# Patient Record
Sex: Female | Born: 1985
Health system: Southern US, Community
[De-identification: ages and names within clinical notes are randomized; demographics above are authoritative.]

## PROBLEM LIST (undated history)

## (undated) DIAGNOSIS — J45909 Unspecified asthma, uncomplicated: Secondary | ICD-10-CM

## (undated) DIAGNOSIS — F419 Anxiety disorder, unspecified: Secondary | ICD-10-CM

## (undated) DIAGNOSIS — D219 Benign neoplasm of connective and other soft tissue, unspecified: Secondary | ICD-10-CM

## (undated) DIAGNOSIS — G43909 Migraine, unspecified, not intractable, without status migrainosus: Secondary | ICD-10-CM

## (undated) HISTORY — PX: NO PAST SURGERIES: SHX2092

---
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
## 2005-04-22 ENCOUNTER — Emergency Department (HOSPITAL_COMMUNITY): Admission: EM | Admit: 2005-04-22 | Discharge: 2005-04-22 | Payer: Self-pay | Admitting: Family Medicine

## 2005-10-01 ENCOUNTER — Emergency Department (HOSPITAL_COMMUNITY): Admission: EM | Admit: 2005-10-01 | Discharge: 2005-10-01 | Payer: Self-pay | Admitting: Emergency Medicine

## 2006-06-26 ENCOUNTER — Emergency Department (HOSPITAL_COMMUNITY): Admission: EM | Admit: 2006-06-26 | Discharge: 2006-06-26 | Payer: Self-pay | Admitting: Family Medicine

## 2007-01-30 ENCOUNTER — Inpatient Hospital Stay (HOSPITAL_COMMUNITY): Admission: AD | Admit: 2007-01-30 | Discharge: 2007-01-30 | Payer: Self-pay | Admitting: Obstetrics and Gynecology

## 2007-02-01 ENCOUNTER — Emergency Department (HOSPITAL_COMMUNITY): Admission: EM | Admit: 2007-02-01 | Discharge: 2007-02-01 | Payer: Self-pay | Admitting: Emergency Medicine

## 2007-04-13 ENCOUNTER — Emergency Department (HOSPITAL_COMMUNITY): Admission: EM | Admit: 2007-04-13 | Discharge: 2007-04-13 | Payer: Self-pay | Admitting: Emergency Medicine

## 2007-06-02 ENCOUNTER — Emergency Department (HOSPITAL_COMMUNITY): Admission: EM | Admit: 2007-06-02 | Discharge: 2007-06-02 | Payer: Self-pay | Admitting: Emergency Medicine

## 2008-11-06 ENCOUNTER — Emergency Department (HOSPITAL_COMMUNITY): Admission: EM | Admit: 2008-11-06 | Discharge: 2008-11-06 | Payer: Self-pay | Admitting: Emergency Medicine

## 2009-03-20 ENCOUNTER — Emergency Department (HOSPITAL_COMMUNITY): Admission: EM | Admit: 2009-03-20 | Discharge: 2009-03-20 | Payer: Self-pay | Admitting: Emergency Medicine

## 2010-02-04 ENCOUNTER — Encounter: Payer: Self-pay | Admitting: Internal Medicine

## 2010-03-29 ENCOUNTER — Telehealth: Payer: Self-pay | Admitting: Internal Medicine

## 2010-03-30 ENCOUNTER — Ambulatory Visit: Payer: Self-pay | Admitting: Internal Medicine

## 2010-03-30 DIAGNOSIS — J45909 Unspecified asthma, uncomplicated: Secondary | ICD-10-CM | POA: Insufficient documentation

## 2010-04-01 ENCOUNTER — Ambulatory Visit: Payer: Self-pay | Admitting: Internal Medicine

## 2010-04-13 ENCOUNTER — Ambulatory Visit
Admission: RE | Admit: 2010-04-13 | Discharge: 2010-04-13 | Payer: Self-pay | Source: Home / Self Care | Attending: Internal Medicine | Admitting: Internal Medicine

## 2010-04-15 ENCOUNTER — Encounter: Payer: Self-pay | Admitting: Internal Medicine

## 2010-04-15 ENCOUNTER — Ambulatory Visit
Admission: RE | Admit: 2010-04-15 | Discharge: 2010-04-15 | Payer: Self-pay | Source: Home / Self Care | Attending: Internal Medicine | Admitting: Internal Medicine

## 2010-04-19 ENCOUNTER — Ambulatory Visit: Admit: 2010-04-19 | Payer: Self-pay | Admitting: Internal Medicine

## 2010-05-13 NOTE — Assessment & Plan Note (Signed)
Summary: tb reading//cm  Nurse Visit   Allergies: No Known Drug Allergies  PPD Results    Date of reading: 04/01/2010    Results: < 5mm    Interpretation: negative

## 2010-05-13 NOTE — Assessment & Plan Note (Signed)
Summary: TB READING/RCD  Nurse Visit   Vitals Entered By: Duard Brady LPN (April 15, 2010 2:14 PM)  Allergies: No Known Drug Allergies  PPD Results    Date of reading: 04/15/2010    Results: < 5mm    Interpretation: negative

## 2010-05-13 NOTE — Letter (Signed)
Summary: TB Skin Test  All     ,     Phone:   Fax:           TB Skin Test    Nikka HENDERSON    Date TB Test Placed:  ________________  L or R forearm  TB Test Placed by:  ___________________  Lot #:  __________________        Expiration Date: _____________  Date TB Test Read:  ____________________    Result ___________MM  TB Test Read by:  _______________

## 2010-05-13 NOTE — Miscellaneous (Signed)
Summary: TDAP Vaccine/[facility]  TDAP Vaccine/[facility]   Imported By: Maryln Gottron 04/21/2010 13:00:33  _____________________________________________________________________  External Attachment:    Type:   Image     Comment:   External Document

## 2010-05-13 NOTE — Letter (Signed)
Summary: PATIENT HX FORM  PATIENT HX FORM   Imported By: Georgian Co 04/01/2010 13:56:13  _____________________________________________________________________  External Attachment:    Type:   Image     Comment:   External Document

## 2010-05-13 NOTE — Assessment & Plan Note (Signed)
Summary: fup/tb test and varicella titer/ok per Dr K/cjr   Vital Signs:  Patient profile:   25 year old female Height:      66 inches Weight:      181 pounds BMI:     29.32 Temp:     98.4 degrees F oral BP sitting:   108 / 68  (left arm) Cuff size:   regular  Vitals Entered By: Duard Brady LPN (March 30, 2010 3:21 PM) CC: re-establish  Is Patient Diabetic? No   CC:  re-establish .  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  She is also seen to reestablish with our practice.  She has enjoyed excellent health with some mild exercise-induced asthma only.  She has had a Pap smear earlier  this year and does exercise regularly  Preventive Screening-Counseling & Management  Alcohol-Tobacco     Smoking Status: quit  Caffeine-Diet-Exercise     Does Patient Exercise: yes  Allergies: No Known Drug Allergies  Past History:  Past Medical History: Asthma-exercise-induced  Past Surgical History: unremarkable  Family History: Reviewed history and no changes required. Father age 87- good health, h/o alcohol Mother age 24- good health one sister- healthy  Social History: Reviewed history and no changes required. Single romote tobcco Regular exercise-yes Nursing studentSmoking Status:  quit Does Patient Exercise:  yes  Review of Systems  The patient denies anorexia, fever, weight loss, weight gain, vision loss, decreased hearing, hoarseness, chest pain, syncope, dyspnea on exertion, peripheral edema, prolonged cough, headaches, hemoptysis, abdominal pain, melena, hematochezia, severe indigestion/heartburn, hematuria, incontinence, genital sores, muscle weakness, suspicious skin lesions, transient blindness, difficulty walking, depression, unusual weight change, abnormal bleeding, enlarged lymph nodes, angioedema, and breast masses.    Physical Exam  General:  Well-developed,well-nourished,in no acute distress;  alert,appropriate and cooperative throughout examination Head:  Normocephalic and atraumatic without obvious abnormalities. No apparent alopecia or balding. Eyes:  No corneal or conjunctival inflammation noted. EOMI. Perrla. Funduscopic exam benign, without hemorrhages, exudates or papilledema. Vision grossly normal. Ears:  External ear exam shows no significant lesions or deformities.  Otoscopic examination reveals clear canals, tympanic membranes are intact bilaterally without bulging, retraction, inflammation or discharge. Hearing is grossly normal bilaterally. Mouth:  Oral mucosa and oropharynx without lesions or exudates.  Teeth in good repair. Neck:  No deformities, masses, or tenderness noted. Chest Wall:  No deformities, masses, or tenderness noted. Lungs:  Normal respiratory effort, chest expands symmetrically. Lungs are clear to auscultation, no crackles or wheezes. Heart:  Normal rate and regular rhythm. S1 and S2 normal without gallop, murmur, click, rub or other extra sounds. Abdomen:  Bowel sounds positive,abdomen soft and non-tender without masses, organomegaly or hernias noted. Msk:  No deformity or scoliosis noted of thoracic or lumbar spine.   Pulses:  R and L carotid,radial,femoral,dorsalis pedis and posterior tibial pulses are full and equal bilaterally Extremities:  No clubbing, cyanosis, edema, or deformity noted with normal full range of motion of all joints.   Neurologic:  alert & oriented X3, cranial nerves II-XII intact, gait normal, and DTRs symmetrical and normal.  alert & oriented X3, cranial nerves II-XII intact, gait normal, and DTRs symmetrical and normal.   Skin:  Intact without suspicious lesions or rashes Cervical Nodes:  No lymphadenopathy noted Axillary Nodes:  No palpable lymphadenopathy Inguinal Nodes:  No significant adenopathy Psych:  Cognition and judgment appear intact. Alert and cooperative with normal attention span and concentration. No apparent  delusions, illusions, hallucinations   Impression &  Recommendations:  Problem # 1:  Preventive Health Care (ICD-V70.0)  Complete Medication List: 1)  Proventil Hfa 108 (90 Base) Mcg/act Aers (Albuterol sulfate) .... Prn  Other Orders: TB Skin Test [phone]) Admin 1st Vaccine (60454) Varicella  [phone]) Admin of Any Addtl Vaccine (91478)  Patient Instructions: 1)  Limit your Sodium (Salt). 2)  It is important that you exercise regularly at least 20 minutes 5 times a week. If you develop chest pain, have severe difficulty breathing, or feel very tired , stop exercising immediately and seek medical attention.   Orders Added: 1)  Est. Patient 18-39 years [99395] 2)  TB Skin Test [86580] 3)  Admin 1st Vaccine [90471] 4)  Varicella  [90716] 5)  Admin of Any Addtl Vaccine [90472]   Immunization History:  Varicella Immunization History:    Varicella # 1:  Varicella (03/30/2010)  Tetanus/Td Immunization History:    Tetanus/Td:  Tdap (04/25/2009)  Influenza Immunization History:    Influenza:  Historical (02/09/2010)  Immunizations Administered:  PPD Skin Test:    Vaccine Type: PPD    Site: right forearm    Mfr: Sanofi Pasteur    Dose: 0.1 ml    Route: ID    Given by: Duard Brady LPN    Exp. Date: 02/11/2011    Lot #: [id number]    Physician counseled: yes  Varicella Vaccine # 1:    Vaccine Type: Varicella    Site: left deltoid    Mfr: Merck    Dose: 0.5 ml    Route: IM    Given by: Duard Brady LPN    Exp. Date: 06/25/2011    Lot #: [id number]    VIS given: 06/22/06 version given March 30, 2010.    Physician counseled: yes   Immunization History:  Tetanus/Td Immunization History:    Tetanus/Td:  Tdap (04/25/2009)  Influenza Immunization History:    Influenza:  Historical (02/09/2010)  Immunizations Administered:  PPD Skin Test:    Vaccine Type: PPD    Site: right forearm    Mfr: Sanofi Pasteur    Dose: 0.1 ml    Route: ID    Given by:  Duard Brady LPN    Exp. Date: 02/11/2011    Lot #: [id number]    Physician counseled: yes  Varicella Vaccine # 1:    Vaccine Type: Varicella    Site: left deltoid    Mfr: Merck    Dose: 0.5 ml    Route: IM    Given by: Duard Brady LPN    Exp. Date: 06/25/2011    Lot #: [id number]    VIS given: 06/22/06 version given March 30, 2010.    Physician counseled: yes

## 2010-05-13 NOTE — Progress Notes (Signed)
Summary: Pt req tb test and varicella titer asap and to re-est   Phone Note Call from Patient Call back at Home Phone [phone]   Caller: Patient Summary of Call: Pt called and said that she needs to get a 2 part tb test and varicella titer done before January. Pt is req to re-est with Dr. Amador Cunas. Pt last seen in 2006. Pls advise.  Initial call taken by: Lucy Antigua,  March 29, 2010 10:14 AM  Follow-up for Phone Call        ok to see in 15 minute slot Follow-up by: Gordy Savers  MD,  March 29, 2010 10:50 AM  Additional Follow-up for Phone Call Additional follow up Details #1::        Lft vm for pt to cb. Waiting on call.  Additional Follow-up by: Lucy Antigua,  March 29, 2010 1:12 PM    Additional Follow-up for Phone Call Additional follow up Details #2::    Pt called back and she has been sch for ov on 03/30/10 3:15, as noted above Follow-up by: Lucy Antigua,  March 29, 2010 1:20 PM

## 2010-05-13 NOTE — Assessment & Plan Note (Signed)
Summary: tb test/njr/pt rescd//ccm  #2  Nurse Visit   Allergies: No Known Drug Allergies  Immunizations Administered:  PPD Skin Test:    Vaccine Type: PPD    Site: left forearm    Mfr: Sanofi Pasteur    Dose: 0.1 ml    Route: ID    Given by: Duard Brady LPN    Exp. 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

## 2010-05-13 NOTE — Miscellaneous (Signed)
Summary: Records from 1987 - 1999   Records from 1987 - 1999   Imported By: Maryln Gottron 04/21/2010 13:02:26  _____________________________________________________________________  External Attachment:    Type:   Image     Comment:   External Document

## 2010-05-17 ENCOUNTER — Ambulatory Visit: Payer: Federal, State, Local not specified - PPO | Admitting: Internal Medicine

## 2010-05-17 DIAGNOSIS — Z0000 Encounter for general adult medical examination without abnormal findings: Secondary | ICD-10-CM

## 2010-05-17 DIAGNOSIS — Z23 Encounter for immunization: Secondary | ICD-10-CM

## 2010-05-17 MED ORDER — VARICELLA VIRUS VACCINE LIVE ~~LOC~~ INJ: 0.5000 mL | INJECTION | Freq: Once | SUBCUTANEOUS | Status: DC

## 2010-07-18 LAB — URINALYSIS, ROUTINE W REFLEX MICROSCOPIC
Bilirubin Urine: NEGATIVE
Glucose, UA: NEGATIVE mg/dL
Hgb urine dipstick: NEGATIVE
Ketones, ur: NEGATIVE mg/dL
Nitrite: NEGATIVE
Protein, ur: NEGATIVE mg/dL
Specific Gravity, Urine: 1.018 (ref 1.005–1.030)
Urobilinogen, UA: 0.2 mg/dL (ref 0.0–1.0)
pH: 6 (ref 5.0–8.0)

## 2010-12-30 LAB — POCT RAPID STREP A: Streptococcus, Group A Screen (Direct): NEGATIVE

## 2011-01-19 LAB — WET PREP, GENITAL: Trich, Wet Prep: NONE SEEN

## 2011-01-19 LAB — URINALYSIS, ROUTINE W REFLEX MICROSCOPIC
Bilirubin Urine: NEGATIVE
Glucose, UA: NEGATIVE
Hgb urine dipstick: NEGATIVE
Ketones, ur: NEGATIVE
Nitrite: NEGATIVE
Protein, ur: NEGATIVE
Specific Gravity, Urine: >1.03 — ABNORMAL HIGH
Urobilinogen, UA: 0.2
pH: 5.5

## 2011-01-19 LAB — POCT PREGNANCY, URINE
Operator id: 22333
Preg Test, Ur: POSITIVE

## 2011-01-19 LAB — GC/CHLAMYDIA PROBE AMP, GENITAL
Chlamydia, DNA Probe: NEGATIVE
GC Probe Amp, Genital: NEGATIVE

## 2011-01-19 LAB — POCT RAPID STREP A: Streptococcus, Group A Screen (Direct): NEGATIVE

## 2011-07-22 ENCOUNTER — Encounter: Payer: Self-pay | Admitting: Family

## 2011-07-22 ENCOUNTER — Ambulatory Visit (INDEPENDENT_AMBULATORY_CARE_PROVIDER_SITE_OTHER): Payer: 59 | Admitting: Family

## 2011-07-22 VITALS — BP 106/64 | Temp 98.7°F | Wt 186.0 lb

## 2011-07-22 DIAGNOSIS — J45901 Unspecified asthma with (acute) exacerbation: Secondary | ICD-10-CM

## 2011-07-22 DIAGNOSIS — R05 Cough: Secondary | ICD-10-CM

## 2011-07-22 MED ORDER — ALBUTEROL SULFATE HFA 108 (90 BASE) MCG/ACT IN AERS: 2.0000 | INHALATION_SPRAY | Freq: Four times a day (QID) | RESPIRATORY_TRACT | Status: DC | PRN

## 2011-07-22 MED ORDER — ALBUTEROL SULFATE (2.5 MG/3ML) 0.083% IN NEBU: 2.5000 mg | INHALATION_SOLUTION | Freq: Four times a day (QID) | RESPIRATORY_TRACT | Status: DC | PRN

## 2011-07-22 MED ORDER — METHYLPREDNISOLONE ACETATE 40 MG/ML IJ SUSP
80.0000 mg | Freq: Once | INTRAMUSCULAR | Status: AC
  Administered 2011-07-22: 80 mg via INTRAMUSCULAR

## 2011-07-22 NOTE — Progress Notes (Signed)
Addended by: Beverely Low on: 07/22/2011 03:44 PM   Modules accepted: Orders

## 2011-07-22 NOTE — Progress Notes (Signed)
Subjective:    Patient ID: Diana Mccormick, female    DOB: March 28, 1986, 26 y.o.   MRN: [id number]  HPI Comments: 26 yo black female presents with c/o asthma exacerbation. H/o childhood asthma at age 89. C/o scratchy throat, productive cough with yellow sputum expectorated, chills, muscle aches started Tues and increase shortness or breath and wheezing Tues evening relieved with neb treatment and proair inhaler. Requesting new neb treatment machine and proair inhalant renewal. Had last chest xray in 2000 and has never had allergy testing. Nonsmoker.      Review of Systems  Constitutional: Positive for chills and activity change. Negative for fever, diaphoresis, appetite change and fatigue.  HENT: Negative for hearing loss, sore throat, facial swelling, rhinorrhea, sneezing, drooling, mouth sores, trouble swallowing, neck pain, neck stiffness, dental problem, postnasal drip, sinus pressure, tinnitus and ear discharge.   Eyes: Negative.   Respiratory: Positive for cough, shortness of breath and wheezing. Negative for apnea, choking, chest tightness and stridor.   Cardiovascular: Negative.   Musculoskeletal: Negative for back pain.  Skin: Negative.    No past medical history on file.  History   Social History  . Marital Status: Single    Spouse Name: N/A    Number of Children: N/A  . Years of Education: N/A   Occupational History  . Not on file.   Social History Main Topics  . Smoking status: Not on file  . Smokeless tobacco: Not on file  . Alcohol Use: Not on file  . Drug Use: Not on file  . Sexually Active: Not on file   Other Topics Concern  . Not on file   Social History Narrative  . No narrative on file    No past surgical history on file.  No family history on file.  No Known Allergies  No current outpatient prescriptions on file prior to visit.   Current Facility-Administered Medications on File Prior to Visit  Medication Dose Route Frequency Provider Last  Rate Last Dose  . varicella virus vaccine (live) injection 0.5 mL  0.5 mL Subcutaneous Once Kimberley Irene Kirkland, LPN        BP 409/81  Temp(Src) 98.7 F (37.1 C) (Oral)  Wt 186 lb (84.369 kg)chart    Objective:   Physical Exam  Constitutional: She is oriented to person, place, and time. She appears well-developed and well-nourished. No distress.  HENT:  Right Ear: External ear normal.  Left Ear: External ear normal.  Nose: Nose normal.  Mouth/Throat: Oropharynx is clear and moist. No oropharyngeal exudate.  Eyes: Conjunctivae are normal. Pupils are equal, round, and reactive to light. Right eye exhibits no discharge. Left eye exhibits no discharge.  Cardiovascular: Normal rate, regular rhythm, normal heart sounds and intact distal pulses.  Exam reveals no gallop and no friction rub.   No murmur heard. Pulmonary/Chest: Effort normal and breath sounds normal. No respiratory distress. She has no wheezes. She has no rales. She exhibits no tenderness.       Lung fields diminished throughout. Neb treatment given in office. Post neb lung fields good airflow throughout and clear to auscultation  Neurological: She is alert and oriented to person, place, and time.  Skin: Skin is warm and dry. She is not diaphoretic.          Assessment & Plan:  Assessment: Asthma exacerbation Plan: Proair inhaler, albuterol neb treatments, neb machine. Teaching handouts provided on diagnosis and treatments. Opportunity for questions provided. Encouraged to RTC if s/s get worse. INstructed  to go to closet ED if experience shortness of breath unrelieved with neb treatment or inhaler.

## 2011-07-22 NOTE — Patient Instructions (Signed)
Asthma Attack Prevention HOW CAN ASTHMA BE PREVENTED? Currently, there is no way to prevent asthma from starting. However, you can take steps to control the disease and prevent its symptoms after you have been diagnosed. Learn about your asthma and how to control it. Take an active role to control your asthma by working with your caregiver to create and follow an asthma action plan. An asthma action plan guides you in taking your medicines properly, avoiding factors that make your asthma worse, tracking your level of asthma control, responding to worsening asthma, and seeking emergency care when needed. To track your asthma, keep records of your symptoms, check your peak flow number using a peak flow meter (handheld device that shows how well air moves out of your lungs), and get regular asthma checkups.  Other ways to prevent asthma attacks include:  Use medicines as your caregiver directs.   Identify and avoid things that make your asthma worse (as much as you can).   Keep track of your asthma symptoms and level of control.   Get regular checkups for your asthma.   With your caregiver, write a detailed plan for taking medicines and managing an asthma attack. Then be sure to follow your action plan. Asthma is an ongoing condition that needs regular monitoring and treatment.   Identify and avoid asthma triggers. A number of outdoor allergens and irritants (pollen, mold, cold air, air pollution) can trigger asthma attacks. Find out what causes or makes your asthma worse, and take steps to avoid those triggers (see below).   Monitor your breathing. Learn to recognize warning signs of an attack, such as slight coughing, wheezing or shortness of breath. However, your lung function may already decrease before you notice any signs or symptoms, so regularly measure and record your peak airflow with a home peak flow meter.   Identify and treat attacks early. If you act quickly, you're less likely to have  a severe attack. You will also need less medicine to control your symptoms. When your peak flow measurements decrease and alert you to an upcoming attack, take your medicine as instructed, and immediately stop any activity that may have triggered the attack. If your symptoms do not improve, get medical help.   Pay attention to increasing quick-relief inhaler use. If you find yourself relying on your quick-relief inhaler (such as albuterol), your asthma is not under control. See your caregiver about adjusting your treatment.  IDENTIFY AND CONTROL FACTORS THAT MAKE YOUR ASTHMA WORSE A number of common things can set off or make your asthma symptoms worse (asthma triggers). Keep track of your asthma symptoms for several weeks, detailing all the environmental and emotional factors that are linked with your asthma. When you have an asthma attack, go back to your asthma diary to see which factor, or combination of factors, might have contributed to it. Once you know what these factors are, you can take steps to control many of them.  Allergies: If you have allergies and asthma, it is important to take asthma prevention steps at home. Asthma attacks (worsening of asthma symptoms) can be triggered by allergies, which can cause temporary increased inflammation of your airways. Minimizing contact with the substance to which you are allergic will help prevent an asthma attack. Animal Dander:   Some people are allergic to the flakes of skin or dried saliva from animals with fur or feathers. Keep these pets out of your home.   If you can't keep a pet outdoors, keep the   pet out of your bedroom and other sleeping areas at all times, and keep the door closed.   Remove carpets and furniture covered with cloth from your home. If that is not possible, keep the pet away from fabric-covered furniture and carpets.  Dust Mites:  Many people with asthma are allergic to dust mites. Dust mites are tiny bugs that are found in  every home, in mattresses, pillows, carpets, fabric-covered furniture, bedcovers, clothes, stuffed toys, fabric, and other fabric-covered items.   Cover your mattress in a special dust-proof cover.   Cover your pillow in a special dust-proof cover, or wash the pillow each week in hot water. Water must be hotter than 130 F to kill dust mites. Cold or warm water used with detergent and bleach can also be effective.   Wash the sheets and blankets on your bed each week in hot water.   Try not to sleep or lie on cloth-covered cushions.   Call ahead when traveling and ask for a smoke-free hotel room. Bring your own bedding and pillows, in case the hotel only supplies feather pillows and down comforters, which may contain dust mites and cause asthma symptoms.   Remove carpets from your bedroom and those laid on concrete, if you can.   Keep stuffed toys out of the bed, or wash the toys weekly in hot water or cooler water with detergent and bleach.  Cockroaches:  Many people with asthma are allergic to the droppings and remains of cockroaches.   Keep food and garbage in closed containers. Never leave food out.   Use poison baits, traps, powders, gels, or paste (for example, boric acid).   If a spray is used to kill cockroaches, stay out of the room until the odor goes away.  Indoor Mold:  Fix leaky faucets, pipes, or other sources of water that have mold around them.   Clean moldy surfaces with a cleaner that has bleach in it.  Pollen and Outdoor Mold:  When pollen or mold spore counts are high, try to keep your windows closed.   Stay indoors with windows closed from late morning to afternoon, if you can. Pollen and some mold spore counts are highest at that time.   Ask your caregiver whether you need to take or increase anti-inflammatory medicine before your allergy season starts.  Irritants:   Tobacco smoke is an irritant. If you smoke, ask your caregiver how you can quit. Ask family  members to quit smoking, too. Do not allow smoking in your home or car.   If possible, do not use a wood-burning stove, kerosene heater, or fireplace. Minimize exposure to all sources of smoke, including incense, candles, fires, and fireworks.   Try to stay away from strong odors and sprays, such as perfume, talcum powder, hair spray, and paints.   Decrease humidity in your home and use an indoor air cleaning device. Reduce indoor humidity to below 60 percent. Dehumidifiers or central air conditioners can do this.   Try to have someone else vacuum for you once or twice a week, if you can. Stay out of rooms while they are being vacuumed and for a short while afterward.   If you vacuum, use a dust mask from a hardware store, a double-layered or microfilter vacuum cleaner bag, or a vacuum cleaner with a HEPA filter.   Sulfites in foods and beverages can be irritants. Do not drink beer or wine, or eat dried fruit, processed potatoes, or shrimp if they cause asthma   symptoms.   Cold air can trigger an asthma attack. Cover your nose and mouth with a scarf on cold or windy days.   Several health conditions can make asthma more difficult to manage, including runny nose, sinus infections, reflux disease, psychological stress, and sleep apnea. Your caregiver will treat these conditions, as well.   Avoid close contact with people who have a cold or the flu, since your asthma symptoms may get worse if you catch the infection from them. Wash your hands thoroughly after touching items that may have been handled by people with a respiratory infection.   Get a flu shot every year to protect against the flu virus, which often makes asthma worse for days or weeks. Also get a pneumonia shot once every five to 10 years.  Drugs:  Aspirin and other painkillers can cause asthma attacks. 10% to 20% of people with asthma have sensitivity to aspirin or a group of painkillers called non-steroidal anti-inflammatory drugs  (NSAIDS), such as ibuprofen and naproxen. These drugs are used to treat pain and reduce fevers. Asthma attacks caused by any of these medicines can be severe and even fatal. These drugs must be avoided in people who have known aspirin sensitive asthma. Products with acetaminophen are considered safe for people who have asthma. It is important that people with aspirin sensitivity read labels of all over-the-counter drugs used to treat pain, colds, coughs, and fever.   Beta blockers and ACE inhibitors are other drugs which you should discuss with your caregiver, in relation to your asthma.  ALLERGY SKIN TESTING  Ask your asthma caregiver about allergy skin testing or blood testing (RAST test) to identify the allergens to which you are sensitive. If you are found to have allergies, allergy shots (immunotherapy) for asthma may help prevent future allergies and asthma. With allergy shots, small doses of allergens (substances to which you are allergic) are injected under your skin on a regular schedule. Over a period of time, your body may become used to the allergen and less responsive with asthma symptoms. You can also take measures to minimize your exposure to those allergens. EXERCISE  If you have exercise-induced asthma, or are planning vigorous exercise, or exercise in cold, humid, or dry environments, prevent exercise-induced asthma by following your caregiver's advice regarding asthma treatment before exercising. Document Released: 03/16/2009 Document Revised: 03/17/2011 Document Reviewed: 03/16/2009 [employer] Patient Information 2012 [employer]. 

## 2011-08-10 ENCOUNTER — Ambulatory Visit (INDEPENDENT_AMBULATORY_CARE_PROVIDER_SITE_OTHER): Payer: 59 | Admitting: Family

## 2011-08-10 ENCOUNTER — Encounter: Payer: Self-pay | Admitting: Family

## 2011-08-10 VITALS — BP 110/78 | HR 72 | Temp 98.2°F | Resp 16 | Ht 66.0 in | Wt 184.0 lb

## 2011-08-10 DIAGNOSIS — F419 Anxiety disorder, unspecified: Secondary | ICD-10-CM

## 2011-08-10 DIAGNOSIS — N943 Premenstrual tension syndrome: Secondary | ICD-10-CM

## 2011-08-10 DIAGNOSIS — F411 Generalized anxiety disorder: Secondary | ICD-10-CM

## 2011-08-10 MED ORDER — PAROXETINE HCL 10 MG PO TABS: 10.0000 mg | ORAL_TABLET | ORAL | Status: DC

## 2011-08-10 NOTE — Progress Notes (Signed)
  Subjective:    Patient ID: Diana Mccormick, female    DOB: 06-07-1985, 26 y.o.   MRN: [id number]  HPI 26 year old female is in with complaints premenstrual syndrome. Patient reports about 2 weeks before her menstrual cycle she experiences anger, anxiety, and depression. Her last Schapiro is 07/23/2011. Her menstrual cycles are normal. She is not currently sexually active. Last sexual contact was January 2013. Last Pap smear was 1-1/2 years ago. She sees gynecology.   Review of Systems  Constitutional: Negative.   Respiratory: Negative.   Cardiovascular: Negative.   Gastrointestinal: Negative.   Musculoskeletal: Negative.   Skin: Negative.   Neurological: Negative.   Psychiatric/Behavioral: The patient is nervous/anxious.        Around menstrual cycle   No past medical history on file.  History   Social History  . Marital Status: Single    Spouse Name: N/A    Number of Children: N/A  . Years of Education: N/A   Occupational History  . Not on file.   Social History Main Topics  . Smoking status: Not on file  . Smokeless tobacco: Not on file  . Alcohol Use: Not on file  . Drug Use: Not on file  . Sexually Active: Not on file   Other Topics Concern  . Not on file   Social History Narrative  . No narrative on file    No past surgical history on file.  No family history on file.  No Known Allergies  Current Outpatient Prescriptions on File Prior to Visit  Medication Sig Dispense Refill  . albuterol (PROAIR HFA) 108 (90 BASE) MCG/ACT inhaler Inhale 2 puffs into the lungs every 6 (six) hours as needed for wheezing.  18 g  3  . albuterol (PROVENTIL HFA;VENTOLIN HFA) 108 (90 BASE) MCG/ACT inhaler Inhale 2 puffs into the lungs every 6 (six) hours as needed for wheezing.  1 Inhaler  3  . albuterol (PROVENTIL) (2.5 MG/3ML) 0.083% nebulizer solution Take 3 mLs (2.5 mg total) by nebulization every 6 (six) hours as needed for wheezing.  150 mL  3  . PARoxetine (PAXIL) 10 MG  tablet Take 1 tablet (10 mg total) by mouth every morning.  30 tablet  2   Current Facility-Administered Medications on File Prior to Visit  Medication Dose Route Frequency Provider Last Rate Last Dose  . varicella virus vaccine (live) injection 0.5 mL  0.5 mL Subcutaneous Once Kimberley Irene Kirkland, LPN        BP 528/41  Pulse 72  Temp 98.2 F (36.8 C)  Resp 16  Ht 5\' 6"  (1.676 m)  Wt 184 lb (83.462 kg)  BMI 29.70 kg/m2chart    Objective:   Physical Exam  Constitutional: She is oriented to person, place, and time. She appears well-developed and well-nourished.  Neck: Normal range of motion. Neck supple.  Cardiovascular: Normal rate, regular rhythm and normal heart sounds.   Pulmonary/Chest: Effort normal and breath sounds normal.  Abdominal: Soft. Bowel sounds are normal.  Neurological: She is alert and oriented to person, place, and time.  Skin: Skin is warm and dry.  Psychiatric: She has a normal mood and affect.          Assessment & Plan:  Assessment: PMS, anxiety  Plan: Paxil 10 mg once daily. We'll bring patient back for recheck in 3-4 weeks. Encouraged her to see her gynecologist for Pap smear. Call the office with any question or concerns.

## 2011-08-10 NOTE — Patient Instructions (Signed)
Premenstrual Syndrome Premenstrual syndrome (PMS) or premenstrual disphoric disorder (PMDD) is a mix of emotional and clinical symptoms. PMS occurs 10 to 14 days before the start of a menstrual period. Common symptoms include pelvic pain, headache and mood changes. Most women have PMS to some degree.  CAUSES  The cause is unknown. There is evidence that it is related to the female hormones during the second half of the menstrual cycle. These hormones fluctuate and are thought to affect chemicals in the brain (serotonin) that can influence a person's mood. SYMPTOMS  Symptoms may include any of the following:  Headache.   Swelling of hands and feet.   Abdominal bloating.   Tiredness.   Breast tenderness.   Depression.   Crying spells.   Anxiety.   Irritability.   Confusion.   Joint and muscle pains.   Forgetfulness.   Withdrawal from family, friends and activities.  DIAGNOSIS  Diagnosis is made by your caregiver who will ask you questions about the kind of symptoms you are having, when they occur and what may bring them on. If your are having any of the symptoms listed above that occur 10 to 14 days before your menstrual period, it is strong evidence you have PMS. TREATMENT   Only take over-the-counter or prescription medicines for pain, discomfort or fever as directed by your caregiver.   Oral contraceptives.   Hormone therapy.   Medications that slow down the production of serotonin in the brain (fluoxetine, sertraline and others).   Diuretics. These get rid of extra fluid from your body.   Anti-depression medication when necessary.   Surgery to remove both ovaries. This is a last resort and if no further pregnancies are wanted.   Consider counseling or joining a PMS therapy support group.  HOME CARE INSTRUCTIONS   Exercise regularly as suggested by your caregiver. Exercise especially before your menstrual period.   Eat a regular, well-balanced diet rich in  carbohydrates.   Restrict or eliminate caffeine, alcohol and tobacco consumption.   Be sure to get enough sleep. Practice relaxation techniques.   Drink 64 oz. fluids per day. This is 8 glasses, 8 oz. each. It is best to drink water.   Eliminate known stressors in your life.   Attend relationship or parenting counseling, if needed.   Take a multi-vitamin in the recommended daily dosages.   Calcium, magnesium, vitamin B6 and vitamin E are some times helpful for PMS symptoms.   Take medications as suggested by your caregiver.  SEEK MEDICAL CARE IF:   You need medication for excessive swelling, depression, severe headaches, or because you cannot sleep.   You need help from your caregiver to help you decide if you need to have your ovaries removed because none of your treatment is helping you.  Document Released: 03/25/2000 Document Revised: 03/17/2011 Document Reviewed: 06/25/2008 [employer] Patient Information 2012 [employer]. 

## 2011-09-07 ENCOUNTER — Ambulatory Visit: Payer: 59 | Admitting: Internal Medicine

## 2011-09-07 DIAGNOSIS — Z0289 Encounter for other administrative examinations: Secondary | ICD-10-CM

## 2011-10-03 ENCOUNTER — Encounter: Payer: Self-pay | Admitting: Internal Medicine

## 2011-10-03 ENCOUNTER — Ambulatory Visit (INDEPENDENT_AMBULATORY_CARE_PROVIDER_SITE_OTHER): Payer: 59 | Admitting: Internal Medicine

## 2011-10-03 VITALS — BP 100/60 | Temp 98.0°F | Wt 187.0 lb

## 2011-10-03 DIAGNOSIS — J069 Acute upper respiratory infection, unspecified: Secondary | ICD-10-CM

## 2011-10-03 MED ORDER — HYDROCODONE-HOMATROPINE 5-1.5 MG/5ML PO SYRP
5.0000 mL | ORAL_SOLUTION | Freq: Four times a day (QID) | ORAL | Status: AC | PRN
Start: 2011-10-03 — End: 2011-10-13

## 2011-10-03 NOTE — Progress Notes (Signed)
  Subjective:    Patient ID: Diana Mccormick, female    DOB: Sep 25, 1985, 26 y.o.   MRN: [id number]  HPI  26 year old patient who presents to 7 day history of nonproductive cough head and chest congestion mild sore throat and occasional wheezing she has been using home nebulizer treatments to treat her mild asthma symptoms. No fever or productive cough.    Review of Systems  Constitutional: Positive for fatigue.  HENT: Positive for congestion, sore throat, rhinorrhea and postnasal drip. Negative for hearing loss, dental problem, sinus pressure and tinnitus.   Eyes: Negative for pain, discharge and visual disturbance.  Respiratory: Positive for cough and wheezing. Negative for shortness of breath.   Cardiovascular: Negative for chest pain, palpitations and leg swelling.  Gastrointestinal: Negative for nausea, vomiting, abdominal pain, diarrhea, constipation, blood in stool and abdominal distention.  Genitourinary: Negative for dysuria, urgency, frequency, hematuria, flank pain, vaginal bleeding, vaginal discharge, difficulty urinating, vaginal pain and pelvic pain.  Musculoskeletal: Negative for joint swelling, arthralgias and gait problem.  Skin: Negative for rash.  Neurological: Negative for dizziness, syncope, speech difficulty, weakness, numbness and headaches.  Hematological: Negative for adenopathy.  Psychiatric/Behavioral: Negative for behavioral problems, dysphoric mood and agitation. The patient is not nervous/anxious.        Objective:   Physical Exam  Constitutional: She is oriented to person, place, and time. She appears well-developed and well-nourished. No distress.  HENT:  Head: Normocephalic.  Right Ear: External ear normal.  Left Ear: External ear normal.       Oropharynx slightly erythematous without exudate  Eyes: Conjunctivae and EOM are normal. Pupils are equal, round, and reactive to light.  Neck: Normal range of motion. Neck supple. No thyromegaly present.    Cardiovascular: Normal rate, regular rhythm, normal heart sounds and intact distal pulses.   Pulmonary/Chest: Effort normal and breath sounds normal. No respiratory distress. She has no wheezes.  Abdominal: Soft. Bowel sounds are normal. She exhibits no mass. There is no tenderness.  Musculoskeletal: Normal range of motion.  Lymphadenopathy:    She has no cervical adenopathy.  Neurological: She is alert and oriented to person, place, and time.  Skin: Skin is warm and dry. No rash noted.  Psychiatric: She has a normal mood and affect. Her behavior is normal.          Assessment & Plan:   Viral URI mild pharyngitis History of asthma  Will treat symptomatically. We'll continue when necessary albuterol

## 2011-10-03 NOTE — Patient Instructions (Signed)
Get plenty of rest, Drink lots of  clear liquids, and use Tylenol or ibuprofen for fever and discomfort.    Call or return to clinic prn if these symptoms worsen or fail to improve as anticipated.  

## 2012-05-06 ENCOUNTER — Encounter (HOSPITAL_COMMUNITY): Payer: Self-pay | Admitting: Emergency Medicine

## 2012-05-06 ENCOUNTER — Emergency Department (HOSPITAL_COMMUNITY)
Admission: EM | Admit: 2012-05-06 | Discharge: 2012-05-06 | Disposition: A | Discharge status: Home / Self Care | Payer: 59 | Attending: Emergency Medicine | Admitting: Emergency Medicine

## 2012-05-06 DIAGNOSIS — F430 Acute stress reaction: Secondary | ICD-10-CM | POA: Insufficient documentation

## 2012-05-06 DIAGNOSIS — Y929 Unspecified place or not applicable: Secondary | ICD-10-CM | POA: Insufficient documentation

## 2012-05-06 DIAGNOSIS — J45909 Unspecified asthma, uncomplicated: Secondary | ICD-10-CM | POA: Insufficient documentation

## 2012-05-06 DIAGNOSIS — Z79899 Other long term (current) drug therapy: Secondary | ICD-10-CM | POA: Insufficient documentation

## 2012-05-06 DIAGNOSIS — X58XXXA Exposure to other specified factors, initial encounter: Secondary | ICD-10-CM | POA: Insufficient documentation

## 2012-05-06 DIAGNOSIS — R079 Chest pain, unspecified: Secondary | ICD-10-CM | POA: Insufficient documentation

## 2012-05-06 DIAGNOSIS — Y939 Activity, unspecified: Secondary | ICD-10-CM | POA: Insufficient documentation

## 2012-05-06 DIAGNOSIS — S43499A Other sprain of unspecified shoulder joint, initial encounter: Secondary | ICD-10-CM | POA: Insufficient documentation

## 2012-05-06 DIAGNOSIS — S29011A Strain of muscle and tendon of front wall of thorax, initial encounter: Secondary | ICD-10-CM

## 2012-05-06 HISTORY — DX: Unspecified asthma, uncomplicated: J45.909

## 2012-05-06 NOTE — ED Provider Notes (Signed)
History     CSN: [id number]  Arrival date & time 05/06/12  2316   First MD Initiated Contact with Patient 05/06/12 2332      Chief Complaint  Patient presents with  . Chest Pain    (Consider location/radiation/quality/duration/timing/severity/associated sxs/prior treatment) HPI NICHOL ATOR is a 27 y.o. female with a pertinent medical history only of asthma says she's been breathing well without shortness of breath, as the symptoms, wheezing or stridor presents with right-sided chest pain. This is been intermittent it happened once last week and went away it is been intermittent, present since about 3 hours ago is mild is on the right-sided chest going down to the right-sided bicep she points to the lateral aspect of the pectoralis muscle. Patient works out, but is been irregular with working out recently. No new strenuous activities. Patient says "I think it stressed." Patient recently had a breakup about a month ago and has been having stress over that. She's been having chronic crying spells during the day today concerning that. Patient has no history of sudden cardiac death in family, no history of early coronary artery disease in the family, patient herself has never been diagnosed with hypertension, diabetes, hyperlipidemia, lupus or HIV. She has some mild dizziness and a slight headache, no loss of consciousness, she does have seasonal allergies and some mild rhinorrhea constantly throughout the year for which she does take this and as needed. Patient has no history also in her family of thromboembolic disease, no personal history of clots in the legs or lungs, no hemoptysis, smokes occasionally, no exogenous estrogen use, she has not been hospitalized recently, she's not been bedbound, no recent long travel, long bone fractures or cancer treatment. Denies any cough, productive cough, fevers, chills, Frequency, dysuria, abdominal pain, rash, nausea or vomiting.  Mildly dizzy and slight  headache.  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  Past Medical History  Diagnosis Date  . Asthma     History reviewed. No pertinent past surgical history.  No family history on file.  History  Substance Use Topics  . Smoking status: Never Smoker   . Smokeless tobacco: Never Used  . Alcohol Use: Yes    OB History    Grav Para Term Preterm Abortions TAB SAB Ect Mult Living                  Review of Systems At least 10pt or greater review of systems completed and are negative except where specified in the HPI.  Allergies  Review of patient's allergies indicates no known allergies.  Home Medications   Current Outpatient Rx  Name  Route  Sig  Dispense  Refill  . ALBUTEROL SULFATE HFA 108 (90 BASE) MCG/ACT IN AERS   Inhalation   Inhale 2 puffs into the lungs every 6 (six) hours as needed for wheezing.   1 Inhaler   3   . ALBUTEROL SULFATE (2.5 MG/3ML) 0.083% IN NEBU   Nebulization   Take 3 mLs (2.5 mg total) by nebulization every 6 (six) hours as needed for wheezing.   150 mL   3   . PAROXETINE HCL 10 MG PO TABS   Oral   Take 1 tablet (10 mg total) by mouth every morning.   30 tablet   2     BP 121/71  Pulse 82  Temp 99.1 F (37.3 C) (Oral)  Resp 14  SpO2 98%  LMP 04/18/2012  Physical Exam  Nursing notes reviewed.  Electronic medical record reviewed. VITAL SIGNS:   Filed Vitals:   05/06/12 2327  BP: 121/71  Pulse: 82  Temp: 99.1 F (37.3 C)  TempSrc: Oral  Resp: 14  SpO2: 98%   CONSTITUTIONAL: Awake, oriented, appears non-toxic HENT: Atraumatic, normocephalic, oral mucosa pink and moist, airway patent. Nares patent without drainage. External ears normal. EYES: Conjunctiva clear, EOMI, PERRLA NECK: Trachea midline, non-tender, supple CARDIOVASCULAR: Normal heart rate, Normal rhythm, No murmurs, rubs, gallops PULMONARY/CHEST: Clear to auscultation, no rhonchi, wheezes, or rales. Symmetrical breath  sounds. Mildly tender to palpation along the insertion of the pectoralis major muscle as it crosses the axilla into the humerus ABDOMINAL: Non-distended, soft, non-tender - no rebound or guarding.  BS normal. NEUROLOGIC: Non-focal, moving all four extremities, no gross sensory or motor deficits. EXTREMITIES: No clubbing, cyanosis, or edema SKIN: Warm, Dry, No erythema, No rash  ED Course  Procedures (including critical care time)  Date: 05/06/2012  Rate: 84  Rhythm: normal sinus rhythm  QRS Axis: normal  Intervals: normal  ST/T Wave abnormalities: normal  Conduction Disutrbances: none  Narrative Interpretation: unremarkable, no ST or T wave abnormalities suggestive of ischemia or infarction, no left ventricular hypertrophy, no arrhythmias.  Labs Reviewed - No data to display No results found.   1. Chest pain   2. Pectoralis muscle strain   3. Stress reaction       MDM  EVOLETTE PENDELL is a 27 y.o. 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 I think this is a reasonable treatment plan considering the patient is PERC negative, has no family history of early cardiac disease, no other symptoms besides some mild dizziness and a slight headache. I do not think she's got an acute coronary syndrome at this time, likewise she's less than 2% risk of having been somewhat embolic disease in her chest, via the [facility] rule, history and physical are not consistent with pericarditis, ruptured viscus, pneumonia, aortic dissection, tension PTX or PTX.  The emergent differential diagnosis of chest pain includes: Acute coronary syndrome, pericarditis, aortic dissection, pulmonary embolism, tension pneumothorax, and esophageal rupture.  I explained the diagnosis and have given explicit precautions to return to the ER including any other new or worsening symptoms. The patient understands  and accepts the medical plan as it's been dictated and I have answered their questions. Discharge instructions concerning home care have been given.  I have urged her to followup with her primary care physician. The patient is STABLE and is discharged to home in good condition.              Jones Skene, MD 05/06/12 2358

## 2012-05-06 NOTE — ED Notes (Signed)
MD at bedside. 

## 2012-05-06 NOTE — ED Notes (Signed)
PT. REPORTS RIGHT CHEST PAIN RADIATING TO RIGHT ARM AND RIGHT JAW ONSET THIS EVENING , DENIES SOB , COUGH OR NAUSEA.

## 2012-05-24 ENCOUNTER — Other Ambulatory Visit: Payer: 59

## 2012-05-25 ENCOUNTER — Other Ambulatory Visit: Payer: 59

## 2012-05-31 ENCOUNTER — Encounter: Payer: 59 | Admitting: Internal Medicine

## 2012-06-08 ENCOUNTER — Other Ambulatory Visit (INDEPENDENT_AMBULATORY_CARE_PROVIDER_SITE_OTHER): Payer: 59

## 2012-06-08 DIAGNOSIS — Z0000 Encounter for general adult medical examination without abnormal findings: Secondary | ICD-10-CM

## 2012-06-08 LAB — CBC WITH DIFFERENTIAL/PLATELET
Basophils Relative: 0.8 % (ref 0.0–3.0)
Eosinophils Absolute: 0.2 10*3/uL (ref 0.0–0.7)
MCHC: 33.8 g/dL (ref 30.0–36.0)
MCV: 86.1 fl (ref 78.0–100.0)
Monocytes Absolute: 0.3 10*3/uL (ref 0.1–1.0)
Neutrophils Relative %: 38.7 % — ABNORMAL LOW (ref 43.0–77.0)
RBC: 4.49 Mil/uL (ref 3.87–5.11)
RDW: 13.2 % (ref 11.5–14.6)

## 2012-06-08 LAB — POCT URINALYSIS DIPSTICK
Ketones, UA: NEGATIVE
Leukocytes, UA: NEGATIVE
Protein, UA: NEGATIVE
Urobilinogen, UA: 0.2

## 2012-06-08 LAB — BASIC METABOLIC PANEL
BUN: 11 mg/dL (ref 6–23)
CO2: 24 mEq/L (ref 19–32)
Chloride: 105 mEq/L (ref 96–112)
Creatinine, Ser: 0.8 mg/dL (ref 0.4–1.2)
Glucose, Bld: 93 mg/dL (ref 70–99)

## 2012-06-08 LAB — HEPATIC FUNCTION PANEL
Albumin: 4.1 g/dL (ref 3.5–5.2)
Bilirubin, Direct: 0.1 mg/dL (ref 0.0–0.3)
Total Protein: 7.5 g/dL (ref 6.0–8.3)

## 2012-06-08 LAB — LIPID PANEL: Cholesterol: 169 mg/dL (ref 0–200)

## 2012-06-15 ENCOUNTER — Encounter: Payer: 59 | Admitting: Internal Medicine

## 2012-07-09 ENCOUNTER — Ambulatory Visit (INDEPENDENT_AMBULATORY_CARE_PROVIDER_SITE_OTHER): Payer: 59 | Admitting: Internal Medicine

## 2012-07-09 ENCOUNTER — Encounter: Payer: Self-pay | Admitting: Internal Medicine

## 2012-07-09 VITALS — BP 102/60 | HR 64 | Temp 98.3°F | Resp 18 | Ht 65.5 in | Wt 180.0 lb

## 2012-07-09 DIAGNOSIS — J45909 Unspecified asthma, uncomplicated: Secondary | ICD-10-CM

## 2012-07-09 DIAGNOSIS — Z0000 Encounter for general adult medical examination without abnormal findings: Secondary | ICD-10-CM

## 2012-07-09 MED ORDER — ALBUTEROL SULFATE HFA 108 (90 BASE) MCG/ACT IN AERS: 2.0000 | INHALATION_SPRAY | Freq: Four times a day (QID) | RESPIRATORY_TRACT | Status: DC | PRN

## 2012-07-09 NOTE — Patient Instructions (Signed)
It is important that you exercise regularly, at least 20 minutes 3 to 4 times per week.  If you develop chest pain or shortness of breath seek  medical attention.  Return in one year for follow-up   

## 2012-07-09 NOTE — Progress Notes (Signed)
Patient ID: Diana Mccormick, female   DOB: 11/29/1985, 27 y.o.   MRN: [id number]   70 -year-old patient who is seen today for a health maintenance exam   Preventive Screening-Counseling & Management  Alcohol-Tobacco  Smoking Status: quit  Caffeine-Diet-Exercise  Does Patient Exercise: yes   Allergies:  No Known Drug Allergies  Past History:  Past Medical History:  Asthma-exercise-induced   Past Surgical History:  unremarkable   Family History:  Reviewed history and no changes required.  Father age 23- good health, h/o alcohol  Mother age 76- good health  one sister- healthy   Social History:  Reviewed history and no changes required.  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

## 2012-07-09 NOTE — Progress Notes (Signed)
  Subjective:    Patient ID: Diana Mccormick, female    DOB: [birth year], 27 y.o.   MRN: [id number]  HPI 27 year old patient who is seen today for a preventive health examination    Preventive Screening-Counseling & Management  Alcohol-Tobacco  Smoking Status: quit  Caffeine-Diet-Exercise  Does Patient Exercise: yes   Allergies:  No Known Drug Allergies  Past History:  Past Medical History:  Asthma-exercise-induced   Past Surgical History:  unremarkable   Family History:  Reviewed history and no changes required.  Father age 28- good health, h/o alcohol  Mother age 46- good health  one sister- healthy   Social History:  Reviewed history and no changes required.  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  HENT: Negative for hearing loss, ear pain, nosebleeds, congestion, sore throat, mouth sores, trouble swallowing, neck stiffness, dental problem, voice change, sinus pressure and tinnitus.   Eyes: Negative for photophobia, pain, redness and visual disturbance.  Respiratory: Negative for cough, chest tightness and shortness of breath.   Cardiovascular: Negative for chest pain, palpitations and leg swelling.  Gastrointestinal: Negative for nausea, vomiting, abdominal pain, diarrhea, constipation, blood in stool, abdominal distention and rectal pain.  Genitourinary: Negative for dysuria, urgency, frequency, hematuria, flank pain, vaginal bleeding, vaginal discharge, difficulty urinating, genital sores, vaginal pain, menstrual problem and pelvic pain.  Musculoskeletal: Negative for back pain and arthralgias.  Skin: Negative for rash.  Neurological: Negative for dizziness, syncope, speech difficulty, weakness, light-headedness, numbness and headaches.  Hematological: Negative for adenopathy. Does not  bruise/bleed easily.  Psychiatric/Behavioral: Negative for suicidal ideas, behavioral problems, self-injury, dysphoric mood and agitation. The patient is not nervous/anxious.        Objective:   Physical Exam  Constitutional: She is oriented to person, place, and time. She appears well-developed and well-nourished.  HENT:  Head: Normocephalic and atraumatic.  Right Ear: External ear normal.  Left Ear: External ear normal.  Mouth/Throat: Oropharynx is clear and moist.  Eyes: Conjunctivae and EOM are normal.  Neck: Normal range of motion. Neck supple. No JVD present. No thyromegaly present.  Cardiovascular: Normal rate, regular rhythm, normal heart sounds and intact distal pulses.   No murmur heard. Pulmonary/Chest: Effort normal and breath sounds normal. She has no wheezes. She has no rales.  Abdominal: Soft. Bowel sounds are normal. She exhibits no distension and no mass. There is no tenderness. There is no rebound and no guarding.  Genitourinary: Vagina normal.  Musculoskeletal: Normal range of motion. She exhibits no edema and no tenderness.  Neurological: She is alert and oriented to person, place, and time. She has normal reflexes. No cranial nerve deficit. She exhibits normal muscle tone. Coordination normal.  Skin: Skin is warm and dry. No rash noted.  Psychiatric: She has a normal mood and affect. Her behavior is normal.          Assessment & Plan:  Preventive health examination Mild exercise-induced asthma stable. We'll continue when necessary albuterol  Return here in one year or as needed

## 2012-11-29 ENCOUNTER — Encounter: Payer: Self-pay | Admitting: Internal Medicine

## 2012-12-23 ENCOUNTER — Emergency Department (HOSPITAL_COMMUNITY)
Admission: EM | Admit: 2012-12-23 | Discharge: 2012-12-23 | Disposition: A | Discharge status: Home / Self Care | Payer: 59 | Source: Home / Self Care

## 2012-12-23 ENCOUNTER — Emergency Department (INDEPENDENT_AMBULATORY_CARE_PROVIDER_SITE_OTHER): Payer: 59

## 2012-12-23 ENCOUNTER — Encounter (HOSPITAL_COMMUNITY): Payer: Self-pay | Admitting: *Deleted

## 2012-12-23 DIAGNOSIS — J45901 Unspecified asthma with (acute) exacerbation: Secondary | ICD-10-CM

## 2012-12-23 DIAGNOSIS — J069 Acute upper respiratory infection, unspecified: Secondary | ICD-10-CM

## 2012-12-23 DIAGNOSIS — J45909 Unspecified asthma, uncomplicated: Secondary | ICD-10-CM

## 2012-12-23 MED ORDER — ALBUTEROL SULFATE (5 MG/ML) 0.5% IN NEBU
INHALATION_SOLUTION | RESPIRATORY_TRACT | Status: AC
  Filled 2012-12-23: qty 0.5

## 2012-12-23 MED ORDER — METHYLPREDNISOLONE SODIUM SUCC 125 MG IJ SOLR: 125.0000 mg | Freq: Once | INTRAMUSCULAR | Status: DC

## 2012-12-23 MED ORDER — IPRATROPIUM-ALBUTEROL 0.5-2.5 (3) MG/3ML IN SOLN: 3.0000 mL | Freq: Four times a day (QID) | RESPIRATORY_TRACT | Status: DC | PRN

## 2012-12-23 MED ORDER — METHYLPREDNISOLONE ACETATE 80 MG/ML IJ SUSP
INTRAMUSCULAR | Status: AC
  Filled 2012-12-23: qty 2

## 2012-12-23 MED ORDER — IPRATROPIUM-ALBUTEROL 0.5-2.5 (3) MG/3ML IN SOLN: 3.0000 mL | RESPIRATORY_TRACT | Status: DC

## 2012-12-23 MED ORDER — FEXOFENADINE HCL 180 MG PO TABS: 180.0000 mg | ORAL_TABLET | Freq: Every day | ORAL | Status: DC

## 2012-12-23 MED ORDER — METHYLPREDNISOLONE SODIUM SUCC 125 MG IJ SOLR
120.0000 mg | Freq: Once | INTRAMUSCULAR | Status: AC
  Administered 2012-12-23: 120 mg via INTRAMUSCULAR

## 2012-12-23 MED ORDER — IPRATROPIUM-ALBUTEROL 0.5-2.5 (3) MG/3ML IN SOLN
3.0000 mL | Freq: Once | RESPIRATORY_TRACT | Status: AC
  Administered 2012-12-23: 3 mL via RESPIRATORY_TRACT

## 2012-12-23 MED ORDER — ALBUTEROL SULFATE (2.5 MG/3ML) 0.083% IN NEBU: 2.5000 mg | INHALATION_SOLUTION | RESPIRATORY_TRACT | Status: DC | PRN

## 2012-12-23 MED ORDER — PREDNISONE 50 MG PO TABS: ORAL_TABLET | ORAL | Status: DC

## 2012-12-23 NOTE — ED Provider Notes (Signed)
CSN: [id number]     Arrival date & time 12/23/12  0932 History   None    Chief Complaint  Patient presents with  . Asthma   (Consider location/radiation/quality/duration/timing/severity/associated sxs/prior Treatment) HPI Comments: 27 year old female presents complaining of worsening asthma for the past 4 days. She has been using her albuterol inhaler without relief. She has shortness of breath, wheezing, chest tightness, and a cough productive of brown sputum. She has subjective fever as well, not measured. Her chest tightness is made worse with taking a deep breath. 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 Denies any history of DVT or PE. No abdominal pain, NVD, rash.   Past Medical History  Diagnosis Date  . Asthma    History reviewed. No pertinent past surgical history. No family history on file. History  Substance Use Topics  . Smoking status: Never Smoker   . Smokeless tobacco: Never Used  . Alcohol Use: Yes   OB History   Grav Para Term Preterm Abortions TAB SAB Ect Mult Living                 Review of Systems  Constitutional: Positive for fever and fatigue. Negative for chills.  Eyes: Negative for visual disturbance.  Respiratory: Positive for cough (productive of brown sputum ), chest tightness and shortness of breath.   Cardiovascular: Positive for chest pain. Negative for palpitations and leg swelling.  Gastrointestinal: Negative for nausea, vomiting and abdominal pain.  Endocrine: Negative for polydipsia and polyuria.  Genitourinary: Negative for dysuria, urgency and frequency.  Musculoskeletal: Negative for myalgias and arthralgias.  Skin: Negative for rash.  Neurological: Negative for dizziness, weakness and light-headedness.    Allergies  Review of patient's allergies indicates no known allergies.  Home Medications   Current Outpatient Rx  Name  Route  Sig  Dispense  Refill  . albuterol (PROVENTIL  HFA;VENTOLIN HFA) 108 (90 BASE) MCG/ACT inhaler   Inhalation   Inhale 2 puffs into the lungs every 6 (six) hours as needed for wheezing.   1 Inhaler   6   . albuterol (PROVENTIL) (2.5 MG/3ML) 0.083% nebulizer solution   Nebulization   Take 3 mLs (2.5 mg total) by nebulization every 4 (four) hours as needed for wheezing.   75 mL   12   . fexofenadine (ALLEGRA) 180 MG tablet   Oral   Take 1 tablet (180 mg total) by mouth daily.   30 tablet   0   . ipratropium-albuterol (DUONEB) 0.5-2.5 (3) MG/3ML SOLN   Nebulization   Take 3 mLs by nebulization every 6 (six) hours as needed.   360 mL   1   . predniSONE (DELTASONE) 50 MG tablet      1 tab PO QD   4 tablet   0    BP 122/65  Pulse 76  Temp(Src) 98.8 F (37.1 C) (Oral)  Resp 17  SpO2 99%  LMP 11/23/2012 Physical Exam  Nursing note and vitals reviewed. Constitutional: She is oriented to person, place, and time. Vital signs are normal. She appears well-developed and well-nourished. No distress.  HENT:  Head: Normocephalic and atraumatic.  Mouth/Throat: Oropharynx is clear and moist.  Pulmonary/Chest: She is in respiratory distress (mildly increased WOB). She has wheezes (diffuse, worse in LUL). She has no rhonchi. She has no rales.  Neurological: She is alert and oriented to person, place, and time. She has normal strength. Coordination normal.  Skin: Skin is warm and  dry. No rash noted. She is not diaphoretic.  Psychiatric: She has a normal mood and affect. Judgment normal.    ED Course  Procedures (including critical care time) Labs Review Labs Reviewed - No data to display Imaging Review Dg Chest 2 View  12/23/2012   *RADIOLOGY REPORT*  Clinical Data: Asthma, cough for 1 week  CHEST - 2 VIEW  Comparison: 11/06/2008  Findings: The heart size and vascular pattern are normal.  The lungs are clear.  No pleural effusions.  IMPRESSION: Negative   Original Report Authenticated By: Esperanza Heir, M.D.    MDM   1.  Asthma attack   2. URI (upper respiratory infection)    Chest x-ray is completely clear. She has significant improvement with a duo neb. She has significant improvement symptomatically and to auscultation with the breathing treatment. She is nontoxic, stable, breathing comfortably at this time. Discharging to home with oral steroids, antihistamines, and albuterol nebulizer solution. She will followup if not continuing to improve   Meds ordered this encounter  Medications  . DISCONTD: ipratropium-albuterol (DUONEB) 0.5-2.5 (3) MG/3ML nebulizer solution 3 mL    Sig:   . DISCONTD: methylPREDNISolone sodium succinate (SOLU-MEDROL) 125 mg/2 mL injection 125 mg    Sig:   . ipratropium-albuterol (DUONEB) 0.5-2.5 (3) MG/3ML nebulizer solution 3 mL    Sig:   . methylPREDNISolone sodium succinate (SOLU-MEDROL) 125 mg/2 mL injection 120 mg    Sig:   . predniSONE (DELTASONE) 50 MG tablet    Sig: 1 tab PO QD    Dispense:  4 tablet    Refill:  0  . fexofenadine (ALLEGRA) 180 MG tablet    Sig: Take 1 tablet (180 mg total) by mouth daily.    Dispense:  30 tablet    Refill:  0  . albuterol (PROVENTIL) (2.5 MG/3ML) 0.083% nebulizer solution    Sig: Take 3 mLs (2.5 mg total) by nebulization every 4 (four) hours as needed for wheezing.    Dispense:  75 mL    Refill:  12  . ipratropium-albuterol (DUONEB) 0.5-2.5 (3) MG/3ML SOLN    Sig: Take 3 mLs by nebulization every 6 (six) hours as needed.    Dispense:  360 mL    Refill:  1       Graylon Good, PA-C 12/23/12 1117

## 2012-12-23 NOTE — ED Notes (Signed)
Patient complains of shortness of breath and chest congestion.

## 2012-12-24 NOTE — ED Provider Notes (Signed)
Medical screening examination/treatment/procedure(s) were performed by a resident physician or non-physician practitioner and as the supervising physician I was immediately available for consultation/collaboration.  Evan Corey, MD    Evan S Corey, MD 12/24/12 0759 

## 2013-05-28 ENCOUNTER — Ambulatory Visit: Payer: 59 | Admitting: Internal Medicine

## 2013-06-06 ENCOUNTER — Ambulatory Visit: Payer: 59 | Admitting: Internal Medicine

## 2013-07-19 ENCOUNTER — Other Ambulatory Visit: Payer: Self-pay | Admitting: Internal Medicine

## 2013-08-29 ENCOUNTER — Encounter: Payer: Self-pay | Admitting: Internal Medicine

## 2013-08-29 ENCOUNTER — Ambulatory Visit (INDEPENDENT_AMBULATORY_CARE_PROVIDER_SITE_OTHER): Payer: 59 | Admitting: Internal Medicine

## 2013-08-29 VITALS — BP 110/74 | HR 118 | Temp 98.8°F | Wt 171.0 lb

## 2013-08-29 DIAGNOSIS — J45909 Unspecified asthma, uncomplicated: Secondary | ICD-10-CM

## 2013-08-29 DIAGNOSIS — F4323 Adjustment disorder with mixed anxiety and depressed mood: Secondary | ICD-10-CM

## 2013-08-29 MED ORDER — ESCITALOPRAM OXALATE 10 MG PO TABS
10.0000 mg | ORAL_TABLET | Freq: Every day | ORAL | Status: DC
Start: 2013-08-29 — End: 2014-05-08

## 2013-08-29 MED ORDER — ALBUTEROL SULFATE HFA 108 (90 BASE) MCG/ACT IN AERS: 2.0000 | INHALATION_SPRAY | Freq: Four times a day (QID) | RESPIRATORY_TRACT | Status: DC | PRN

## 2013-08-29 MED ORDER — ALBUTEROL SULFATE (2.5 MG/3ML) 0.083% IN NEBU: 2.5000 mg | INHALATION_SOLUTION | RESPIRATORY_TRACT | Status: DC | PRN

## 2013-08-29 MED ORDER — LORAZEPAM 0.5 MG PO TABS: 0.5000 mg | ORAL_TABLET | Freq: Two times a day (BID) | ORAL | Status: DC | PRN

## 2013-08-29 NOTE — Progress Notes (Signed)
Pre visit review using our clinic review tool, if applicable. No additional management support is needed unless otherwise documented below in the visit note. 

## 2013-08-29 NOTE — Progress Notes (Signed)
Subjective:    Patient ID: Diana Mccormick, female    DOB: 11/21/1985, 28 y.o.   MRN: [id number]  HPI  28 year old patient who has a history of asthma.  She has had a number of situational stressors, including a recent separation from a boyfriend.  She is moving into a townhouse and works 2 jobs.  She has had worsening anxiety, depression, and sleep disturbance.  She feels her anxiety has affected her work performance at times, and she has had to leave work early.  She has been quite anxious and at times has difficulty sleeping due to intrusive thoughts  Past Medical History  Diagnosis Date  . Asthma     History   Social History  . Marital Status: Single    Spouse Name: N/A    Number of Children: N/A  . Years of Education: N/A   Occupational History  . Not on file.   Social History Main Topics  . Smoking status: Never Smoker   . Smokeless tobacco: Never Used  . Alcohol Use: Yes  . Drug Use: No  . Sexual Activity: Not on file   Other Topics Concern  . Not on file   Social History Narrative  . No narrative on file    History reviewed. No pertinent past surgical history.  No family history on file.  No Known Allergies  Current Outpatient Prescriptions on File Prior to Visit  Medication Sig Dispense Refill  . albuterol (PROVENTIL) (2.5 MG/3ML) 0.083% nebulizer solution Take 3 mLs (2.5 mg total) by nebulization every 4 (four) hours as needed for wheezing.  75 mL  12  . fexofenadine (ALLEGRA) 180 MG tablet Take 1 tablet (180 mg total) by mouth daily.  30 tablet  0  . ipratropium-albuterol (DUONEB) 0.5-2.5 (3) MG/3ML SOLN Take 3 mLs by nebulization every 6 (six) hours as needed.  360 mL  1  . PROAIR HFA 108 (90 BASE) MCG/ACT inhaler INHALE 2 PUFFS BY MOUTH INTO THE LUNGS EVERY 6 HOURS AS NEEDED FOR WHEEZING  8.5 g  1  . albuterol (PROVENTIL HFA;VENTOLIN HFA) 108 (90 BASE) MCG/ACT inhaler Inhale 2 puffs into the lungs every 6 (six) hours as needed for wheezing.  1 Inhaler   6   Current Facility-Administered Medications on File Prior to Visit  Medication Dose Route Frequency Provider Last Rate Last Dose  . varicella virus vaccine (live) injection 0.5 mL  0.5 mL Subcutaneous Once Kimberley Irene Kirkland, LPN        BP 458/09  Pulse 118  Temp(Src) 98.8 F (37.1 C) (Oral)  Wt 171 lb (77.565 kg)  SpO2 98%       Review of Systems  Constitutional: Negative.   HENT: Negative for congestion, dental problem, hearing loss, rhinorrhea, sinus pressure, sore throat and tinnitus.   Eyes: Negative for pain, discharge and visual disturbance.  Respiratory: Negative for cough and shortness of breath.   Cardiovascular: Negative for chest pain, palpitations and leg swelling.  Gastrointestinal: Negative for nausea, vomiting, abdominal pain, diarrhea, constipation, blood in stool and abdominal distention.  Genitourinary: Negative for dysuria, urgency, frequency, hematuria, flank pain, vaginal bleeding, vaginal discharge, difficulty urinating, vaginal pain and pelvic pain.  Musculoskeletal: Negative for arthralgias, gait problem and joint swelling.  Skin: Negative for rash.  Neurological: Negative for dizziness, syncope, speech difficulty, weakness, numbness and headaches.  Hematological: Negative for adenopathy.  Psychiatric/Behavioral: Positive for dysphoric mood and decreased concentration. Negative for behavioral problems and agitation. The patient is nervous/anxious.  Objective:   Physical Exam  Constitutional: She is oriented to person, place, and time. She appears well-developed and well-nourished.  HENT:  Head: Normocephalic.  Right Ear: External ear normal.  Left Ear: External ear normal.  Mouth/Throat: Oropharynx is clear and moist.  Eyes: Conjunctivae and EOM are normal. Pupils are equal, round, and reactive to light.  Neck: Normal range of motion. Neck supple. No thyromegaly present.  Cardiovascular: Normal rate, regular rhythm, normal heart  sounds and intact distal pulses.   Pulmonary/Chest: Effort normal and breath sounds normal.  Abdominal: Soft. Bowel sounds are normal. She exhibits no mass. There is no tenderness.  Musculoskeletal: Normal range of motion.  Lymphadenopathy:    She has no cervical adenopathy.  Neurological: She is alert and oriented to person, place, and time.  Skin: Skin is warm and dry. No rash noted.  Psychiatric: She has a normal mood and affect. Her behavior is normal.          Assessment & Plan:   Situational stress.  Will start SSRI and when necessary lorazepam.  We'll set up for counseling.  Recheck 6 weeks

## 2013-08-29 NOTE — Patient Instructions (Signed)
Behavioral health referral as discussed  Return in 6 weeks for follow-up 

## 2013-09-09 ENCOUNTER — Ambulatory Visit (INDEPENDENT_AMBULATORY_CARE_PROVIDER_SITE_OTHER): Payer: 59 | Admitting: Licensed Clinical Social Worker

## 2013-09-09 DIAGNOSIS — F411 Generalized anxiety disorder: Secondary | ICD-10-CM

## 2013-09-20 ENCOUNTER — Ambulatory Visit (INDEPENDENT_AMBULATORY_CARE_PROVIDER_SITE_OTHER): Payer: 59 | Admitting: Licensed Clinical Social Worker

## 2013-09-20 DIAGNOSIS — F4323 Adjustment disorder with mixed anxiety and depressed mood: Secondary | ICD-10-CM

## 2013-10-04 ENCOUNTER — Ambulatory Visit (INDEPENDENT_AMBULATORY_CARE_PROVIDER_SITE_OTHER): Payer: 59 | Admitting: Licensed Clinical Social Worker

## 2013-10-04 DIAGNOSIS — F411 Generalized anxiety disorder: Secondary | ICD-10-CM

## 2013-10-21 ENCOUNTER — Ambulatory Visit: Payer: 59 | Admitting: Licensed Clinical Social Worker

## 2013-12-27 ENCOUNTER — Ambulatory Visit (INDEPENDENT_AMBULATORY_CARE_PROVIDER_SITE_OTHER): Payer: 59 | Admitting: Licensed Clinical Social Worker

## 2013-12-27 DIAGNOSIS — F411 Generalized anxiety disorder: Secondary | ICD-10-CM

## 2014-01-17 ENCOUNTER — Ambulatory Visit (INDEPENDENT_AMBULATORY_CARE_PROVIDER_SITE_OTHER): Payer: 59 | Admitting: Licensed Clinical Social Worker

## 2014-01-17 DIAGNOSIS — F411 Generalized anxiety disorder: Secondary | ICD-10-CM

## 2014-01-30 ENCOUNTER — Ambulatory Visit (INDEPENDENT_AMBULATORY_CARE_PROVIDER_SITE_OTHER): Payer: 59 | Admitting: Internal Medicine

## 2014-01-30 ENCOUNTER — Emergency Department (HOSPITAL_COMMUNITY)
Admission: EM | Admit: 2014-01-30 | Discharge: 2014-01-30 | Disposition: A | Discharge status: Home / Self Care | Payer: 59 | Attending: Emergency Medicine | Admitting: Emergency Medicine

## 2014-01-30 ENCOUNTER — Encounter (HOSPITAL_COMMUNITY): Payer: Self-pay | Admitting: Emergency Medicine

## 2014-01-30 ENCOUNTER — Encounter: Payer: Self-pay | Admitting: Internal Medicine

## 2014-01-30 ENCOUNTER — Ambulatory Visit (INDEPENDENT_AMBULATORY_CARE_PROVIDER_SITE_OTHER)
Admission: RE | Admit: 2014-01-30 | Discharge: 2014-01-30 | Disposition: A | Discharge status: Home / Self Care | Payer: 59 | Source: Ambulatory Visit | Attending: Internal Medicine | Admitting: Internal Medicine

## 2014-01-30 ENCOUNTER — Emergency Department (HOSPITAL_COMMUNITY)
Admission: EM | Admit: 2014-01-30 | Discharge: 2014-01-30 | Disposition: A | Discharge status: Nursing Home (Includes ICF) | Payer: 59 | Source: Home / Self Care | Attending: Emergency Medicine | Admitting: Emergency Medicine

## 2014-01-30 VITALS — BP 120/62 | HR 102 | Temp 98.8°F | Wt 183.0 lb

## 2014-01-30 DIAGNOSIS — R6889 Other general symptoms and signs: Secondary | ICD-10-CM

## 2014-01-30 DIAGNOSIS — Z720 Tobacco use: Secondary | ICD-10-CM

## 2014-01-30 DIAGNOSIS — Z79899 Other long term (current) drug therapy: Secondary | ICD-10-CM | POA: Insufficient documentation

## 2014-01-30 DIAGNOSIS — J45909 Unspecified asthma, uncomplicated: Secondary | ICD-10-CM | POA: Diagnosis present

## 2014-01-30 DIAGNOSIS — J45901 Unspecified asthma with (acute) exacerbation: Secondary | ICD-10-CM

## 2014-01-30 LAB — CBC WITH DIFFERENTIAL/PLATELET
BASOS PCT: 0 % (ref 0–1)
Basophils Absolute: 0 10*3/uL (ref 0.0–0.1)
EOS ABS: 0.4 10*3/uL (ref 0.0–0.7)
EOS PCT: 5 % (ref 0–5)
HCT: 35 % — ABNORMAL LOW (ref 36.0–46.0)
Hemoglobin: 11.4 g/dL — ABNORMAL LOW (ref 12.0–15.0)
LYMPHS ABS: 1 10*3/uL (ref 0.7–4.0)
Lymphocytes Relative: 13 % (ref 12–46)
MCH: 28 pg (ref 26.0–34.0)
MCHC: 32.6 g/dL (ref 30.0–36.0)
MCV: 86 fL (ref 78.0–100.0)
Monocytes Absolute: 0.5 10*3/uL (ref 0.1–1.0)
Monocytes Relative: 7 % (ref 3–12)
NEUTROS PCT: 75 % (ref 43–77)
Neutro Abs: 5.8 10*3/uL (ref 1.7–7.7)
Platelets: 241 10*3/uL (ref 150–400)
RBC: 4.07 MIL/uL (ref 3.87–5.11)
RDW: 13.2 % (ref 11.5–15.5)
WBC: 7.8 10*3/uL (ref 4.0–10.5)

## 2014-01-30 LAB — BASIC METABOLIC PANEL
Anion gap: 15 (ref 5–15)
BUN: 5 mg/dL — ABNORMAL LOW (ref 6–23)
CALCIUM: 9 mg/dL (ref 8.4–10.5)
CO2: 21 mEq/L (ref 19–32)
Chloride: 105 mEq/L (ref 96–112)
Creatinine, Ser: 0.68 mg/dL (ref 0.50–1.10)
GLUCOSE: 96 mg/dL (ref 70–99)
POTASSIUM: 3.3 meq/L — AB (ref 3.7–5.3)
Sodium: 141 mEq/L (ref 137–147)

## 2014-01-30 LAB — POCT INFLUENZA A/B
Influenza A, POC: NEGATIVE
Influenza B, POC: NEGATIVE

## 2014-01-30 MED ORDER — IPRATROPIUM-ALBUTEROL 0.5-2.5 (3) MG/3ML IN SOLN
3.0000 mL | Freq: Once | RESPIRATORY_TRACT | Status: AC
  Administered 2014-01-30: 3 mL via RESPIRATORY_TRACT

## 2014-01-30 MED ORDER — METHYLPREDNISOLONE ACETATE 80 MG/ML IJ SUSP
120.0000 mg | Freq: Once | INTRAMUSCULAR | Status: AC
  Administered 2014-01-30: 120 mg via INTRAMUSCULAR

## 2014-01-30 MED ORDER — ALBUTEROL (5 MG/ML) CONTINUOUS INHALATION SOLN
10.0000 mg/h | INHALATION_SOLUTION | RESPIRATORY_TRACT | Status: AC
  Administered 2014-01-30: 10 mg/h via RESPIRATORY_TRACT
  Filled 2014-01-30: qty 20

## 2014-01-30 MED ORDER — SODIUM CHLORIDE 0.9 % IJ SOLN
INTRAMUSCULAR | Status: AC
  Filled 2014-01-30: qty 6

## 2014-01-30 MED ORDER — ALBUTEROL SULFATE HFA 108 (90 BASE) MCG/ACT IN AERS: 2.0000 | INHALATION_SPRAY | RESPIRATORY_TRACT | Status: DC | PRN

## 2014-01-30 MED ORDER — METHYLPREDNISOLONE ACETATE 40 MG/ML IJ SUSP
INTRAMUSCULAR | Status: AC
  Filled 2014-01-30: qty 5

## 2014-01-30 MED ORDER — IPRATROPIUM BROMIDE 0.02 % IN SOLN
0.5000 mg | Freq: Once | RESPIRATORY_TRACT | Status: AC
  Administered 2014-01-30: 0.5 mg via RESPIRATORY_TRACT
  Filled 2014-01-30: qty 2.5

## 2014-01-30 MED ORDER — IPRATROPIUM-ALBUTEROL 0.5-2.5 (3) MG/3ML IN SOLN
RESPIRATORY_TRACT | Status: AC
  Filled 2014-01-30: qty 3

## 2014-01-30 MED ORDER — ACETAMINOPHEN 325 MG PO TABS
650.0000 mg | ORAL_TABLET | Freq: Once | ORAL | Status: AC
  Administered 2014-01-30: 650 mg via ORAL
  Filled 2014-01-30: qty 2

## 2014-01-30 MED ORDER — OSELTAMIVIR PHOSPHATE 75 MG PO CAPS: 75.0000 mg | ORAL_CAPSULE | Freq: Two times a day (BID) | ORAL | Status: DC

## 2014-01-30 MED ORDER — PREDNISONE 20 MG PO TABS: 40.0000 mg | ORAL_TABLET | Freq: Every day | ORAL | Status: DC

## 2014-01-30 MED ORDER — METHYLPREDNISOLONE ACETATE 80 MG/ML IJ SUSP
INTRAMUSCULAR | Status: AC
  Filled 2014-01-30: qty 1

## 2014-01-30 MED ORDER — ALBUTEROL SULFATE (2.5 MG/3ML) 0.083% IN NEBU
5.0000 mg | INHALATION_SOLUTION | Freq: Once | RESPIRATORY_TRACT | Status: AC
  Administered 2014-01-30: 5 mg via RESPIRATORY_TRACT
  Filled 2014-01-30: qty 6

## 2014-01-30 MED ORDER — ALBUTEROL (5 MG/ML) CONTINUOUS INHALATION SOLN
10.0000 mg/h | INHALATION_SOLUTION | RESPIRATORY_TRACT | Status: DC
Start: 2014-01-30 — End: 2014-01-30

## 2014-01-30 MED ORDER — MAGNESIUM SULFATE 40 MG/ML IJ SOLN
2.0000 g | INTRAMUSCULAR | Status: AC
  Administered 2014-01-30: 2 g via INTRAVENOUS
  Filled 2014-01-30: qty 50

## 2014-01-30 MED ORDER — ALBUTEROL SULFATE (2.5 MG/3ML) 0.083% IN NEBU
2.5000 mg | INHALATION_SOLUTION | Freq: Once | RESPIRATORY_TRACT | Status: AC
  Administered 2014-01-30: 2.5 mg via RESPIRATORY_TRACT

## 2014-01-30 MED ORDER — PREDNISONE 20 MG PO TABS: ORAL_TABLET | ORAL | Status: DC

## 2014-01-30 MED ORDER — ALBUTEROL SULFATE (2.5 MG/3ML) 0.083% IN NEBU
INHALATION_SOLUTION | RESPIRATORY_TRACT | Status: AC
  Filled 2014-01-30: qty 3

## 2014-01-30 NOTE — Patient Instructions (Addendum)
This acts liek flu but concern about poss pna  With your asthma flare  Chest x ray  pred and antibiotic  Close fu next week PCP.  Need to discuss controller medication for asthma and stop tobacco al together .

## 2014-01-30 NOTE — ED Notes (Signed)
Patient c/o sob onset last night. Patient reports she has had cold sx including cough, fever, and nasal and chest congestion. Patient reports she was seen at her PCP today and was negative for the flu. Cough exacerbates her asthma. During triage patient is taking short shallow breaths and wheezing. Able to speak in complete sentences and ambulate to room.

## 2014-01-30 NOTE — ED Provider Notes (Signed)
CSN: [id number]     Arrival date & time 01/30/14  1351 History   First MD Initiated Contact with Patient 01/30/14 1500     Chief Complaint  Patient presents with  . Asthma     (Consider location/radiation/quality/duration/timing/severity/associated sxs/prior Treatment) HPI Comments: Pt is a 28 y./o female with hx of Asthma, remote hx of PTX as child - presents with sore throat, cough, fever, nasal drainage and congestion and then became SOB with wheezing - this is persistent, associated with cough and fever, she has been to her primary MD as well as the urgent care.  She has had Solumedrol and CXR prior to arrival as well as multiple neb treatments and continues to wheeze and be SOB.  No tobacco use.  Patient is a 28 y.o. female presenting with asthma. The history is provided by the patient and medical records.  Asthma    Past Medical History  Diagnosis Date  . Asthma    History reviewed. No pertinent past surgical history. History reviewed. No pertinent family history. History  Substance Use Topics  . Smoking status: Light Tobacco Smoker  . Smokeless tobacco: Never Used  . Alcohol Use: Yes   OB History   Grav Para Term Preterm Abortions TAB SAB Ect Mult Living                 Review of Systems  All other systems reviewed and are negative.     Allergies  Review of patient's allergies indicates no known allergies.  Home Medications   Prior to Admission medications   Medication Sig Start Date End Date Taking? Authorizing Provider  albuterol (PROVENTIL HFA;VENTOLIN HFA) 108 (90 BASE) MCG/ACT inhaler Inhale 2 puffs into the lungs every 6 (six) hours as needed for wheezing. 08/29/13 08/29/14 Yes Marletta Lor, MD  albuterol (PROVENTIL) (2.5 MG/3ML) 0.083% nebulizer solution Take 3 mLs (2.5 mg total) by nebulization every 4 (four) hours as needed for wheezing. 08/29/13  Yes Marletta Lor, MD  Cranberry-Vitamin C-Probiotic (AZO CRANBERRY PO) Take 1 tablet by mouth  daily as needed (for pain).   Yes Historical Provider, MD  escitalopram (LEXAPRO) 10 MG tablet Take 1 tablet (10 mg total) by mouth daily. 08/29/13  Yes Marletta Lor, MD  ibuprofen (ADVIL,MOTRIN) 200 MG tablet Take 800 mg by mouth every 6 (six) hours as needed for moderate pain.   Yes Historical Provider, MD  ipratropium-albuterol (DUONEB) 0.5-2.5 (3) MG/3ML SOLN Take 3 mLs by nebulization every 6 (six) hours as needed. 12/23/12  Yes Freeman Caldron Baker, PA-C  loratadine (CLARITIN) 10 MG tablet Take 10 mg by mouth daily.   Yes Historical Provider, MD  LORazepam (ATIVAN) 0.5 MG tablet Take 1 tablet (0.5 mg total) by mouth 2 (two) times daily as needed for anxiety. 08/29/13  Yes Marletta Lor, MD  Multiple Vitamin (MULTIVITAMIN WITH MINERALS) TABS tablet Take 1 tablet by mouth daily.   Yes Historical Provider, MD  naproxen sodium (ANAPROX) 220 MG tablet Take 220 mg by mouth 2 (two) times daily as needed.   Yes Historical Provider, MD  oseltamivir (TAMIFLU) 75 MG capsule Take 1 capsule (75 mg total) by mouth 2 (two) times daily. 01/30/14  Yes Burnis Medin, MD  predniSONE (DELTASONE) 20 MG tablet Take 3 po qd for 2 days then 2 po qd for 3 days,or as directed 01/30/14  Yes Burnis Medin, MD  Probiotic Product (PROBIOTIC PO) Take 1 capsule by mouth daily.   Yes Historical Provider, MD  albuterol (PROVENTIL HFA;VENTOLIN HFA) 108 (90 BASE) MCG/ACT inhaler Inhale 2 puffs into the lungs every 4 (four) hours as needed for wheezing or shortness of breath. 01/30/14   Johnna Acosta, MD  predniSONE (DELTASONE) 20 MG tablet Take 2 tablets (40 mg total) by mouth daily. 01/30/14   Johnna Acosta, MD   BP 93/70  Pulse 111  Temp(Src) 99.3 F (37.4 C) (Oral)  Resp 15  Ht 5\' 5"  (1.651 m)  Wt 180 lb (81.647 kg)  BMI 29.95 kg/m2  SpO2 98%  LMP 01/27/2014 Physical Exam  Nursing note and vitals reviewed. Constitutional: She appears well-developed and well-nourished. No distress.  HENT:  Head:  Normocephalic and atraumatic.  Mouth/Throat: Oropharynx is clear and moist. No oropharyngeal exudate.  Mild clear rhinorrhea from the nares bilaterally.  OP clear.  TM's clear bilaterally  Eyes: Conjunctivae and EOM are normal. Pupils are equal, round, and reactive to light. Right eye exhibits no discharge. Left eye exhibits no discharge. No scleral icterus.  Neck: Normal range of motion. Neck supple. No JVD present. No thyromegaly present.  Cardiovascular: Normal rate, regular rhythm, normal heart sounds and intact distal pulses.  Exam reveals no gallop and no friction rub.   No murmur heard. Pulmonary/Chest: No respiratory distress. She has wheezes. She has no rales.  Mild tachypnea, diffuse expiratory wheezing in all lung fields, no rales  Abdominal: Soft. Bowel sounds are normal. She exhibits no distension and no mass. There is no tenderness.  Musculoskeletal: Normal range of motion. She exhibits no edema and no tenderness.  Lymphadenopathy:    She has no cervical adenopathy.  Neurological: She is alert. Coordination normal.  Skin: Skin is warm and dry. No rash noted. No erythema.  Psychiatric: She has a normal mood and affect. Her behavior is normal.    ED Course  Procedures (including critical care time) Labs Review Labs Reviewed  CBC WITH DIFFERENTIAL - Abnormal; Notable for the following:    Hemoglobin 11.4 (*)    HCT 35.0 (*)    All other components within normal limits  BASIC METABOLIC PANEL - Abnormal; Notable for the following:    Potassium 3.3 (*)    BUN 5 (*)    All other components within normal limits    Imaging Review Dg Chest 2 View  01/30/2014   CLINICAL DATA:  Cough and congestion and fever for 4 days, history of asthma  EXAM: CHEST  2 VIEW  COMPARISON:  12/23/2012  FINDINGS: The heart size and mediastinal contours are within normal limits. Both lungs are clear. The visualized skeletal structures are unremarkable.  IMPRESSION: No active cardiopulmonary disease.    Electronically Signed   By: Inez Catalina M.D.   On: 01/30/2014 11:25     MDM   Final diagnoses:  Asthma exacerbation    Pt has ongoing symptoms despite multiple meds, minimal tachycardia and no significant hypoxia at rest - steroids given, needs time to work, CXR without pna, check labs, meds, recheck.  After continuous nebulizer therapy and intravenous steroids and intravenous magnesium the patient has continued to improve. With ambulation her oxygen drops only to 92% but she tolerates this and she expresses her desire to go home. 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 Next is she will return should her symptoms worsen, there is no signs of pneumonia or other life-threatening process, at rest her oxygen remained at 98-100%.  Meds given  in ED:  Medications  albuterol (PROVENTIL,VENTOLIN) solution continuous neb (10 mg/hr Nebulization New Bag/Given 01/30/14 1515)  albuterol (PROVENTIL) (2.5 MG/3ML) 0.083% nebulizer solution 5 mg (5 mg Nebulization Given 01/30/14 1410)  ipratropium (ATROVENT) nebulizer solution 0.5 mg (0.5 mg Nebulization Given 01/30/14 1410)  acetaminophen (TYLENOL) tablet 650 mg (650 mg Oral Given 01/30/14 1513)  magnesium sulfate IVPB 2 g 50 mL (0 g Intravenous Stopped 01/30/14 1713)    New Prescriptions   ALBUTEROL (PROVENTIL HFA;VENTOLIN HFA) 108 (90 BASE) MCG/ACT INHALER    Inhale 2 puffs into the lungs every 4 (four) hours as needed for wheezing or shortness of breath.   PREDNISONE (DELTASONE) 20 MG TABLET    Take 2 tablets (40 mg total) by mouth daily.          Johnna Acosta, MD 01/30/14 [id number]

## 2014-01-30 NOTE — ED Provider Notes (Signed)
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 For the past 2 days she's had flulike symptoms with cough productive of green to brown sputum, aching in her chest and back, fever to 101.1, nasal congestion, rhinorrhea, and sore throat. This caused her asthma to flare up and she's had wheezing, shortness of breath, and chest tightness. She went to her primary care physician, Dr. Shanon Ace this morning and had an albuterol breathing treatment. She felt better transiently. She was sent over for a chest x-ray which turned out to be normal. Her breathing then got worse in route and she came by here for further treatment.  Review of Systems   Other than as noted above, the patient denies any of the following symptoms. Systemic:  No fever, chills, or sweats. ENT:  No nasal congestion, sneezing, rhinorrhea, or sore throat. Lungs:  No cough, sputum production, or shortness of breath. No chest pain. Skin:  No rash or itching.  [place]   Past medical history, family history, social history, meds, and allergies were reviewed. Current medicines include albuterol, Lexapro, Claritin, lorazepam, and Dr. Regis Bill started her on Tamiflu and Deltasone this morning, but she did not get a chance to take either of these medications yet.  Physical Examination    Vital signs:  BP 118/60  Pulse 100  Temp(Src) 99 F (37.2 C) (Oral)  Resp 24  SpO2 99%  LMP 01/27/2014 General:  Alert, in no distress. Able to speak in full sentences. She has some tachypnea, no audible wheezes, use of accessory muscles, or retractions.  Eye:  No conjunctival injection or drainage. Lids were normal. ENT:  TMs and canals were normal, without erythema or inflammation.  Nasal mucosa was clear and uncongested, without drainage.  Mucous membranes were moist.   Pharynx was clear, without exudate or drainage.  There were no oral ulcerations or lesions. Neck:  Supple, no adenopathy, tenderness or mass. Lungs:  No retractions or use of accessory muscles.  She has good air movement and bilateral inspiratory wheezes initially, no expiratory wheezes, rales, rhonchi. Heart:  Regular rhythm, without gallops, murmers or rubs. Skin:  Clear, warm, and dry, without rash or lesions.  Radiology   Dg Chest 2 View  01/30/2014   CLINICAL DATA:  Cough and congestion and fever for 4 days, history of asthma  EXAM: CHEST  2 VIEW  COMPARISON:  12/23/2012  FINDINGS: The heart size and mediastinal contours are within normal limits. Both lungs are clear. The visualized skeletal structures are unremarkable.  IMPRESSION: No active cardiopulmonary disease.   Electronically Signed   By: Inez Catalina M.D.   On: 01/30/2014 11:25    Course in [facility]   The following medications were given:  Medications  albuterol (PROVENTIL) (2.5 MG/3ML) 0.083% nebulizer solution 2.5 mg (2.5 mg Nebulization Given 01/30/14 1218)  ipratropium-albuterol (DUONEB) 0.5-2.5 (3) MG/3ML nebulizer solution 3 mL (3 mLs Nebulization Given 01/30/14 1218)  methylPREDNISolone acetate (DEPO-MEDROL) injection 120 mg (120 mg Intramuscular Given 01/30/14 1216)   After the above treatments, the patient stated that she did not feel any better. Auscultation of lungs reveals bilateral inspiratory and expiratory wheezes and she was still tachypneic although without use of accessory muscles.    Assessment   The encounter diagnosis was Asthma attack.  She's not improved with outpatient  treatment either here or at her primary care physician's. I'm afraid if I send her home she'll end up coming right back again. I think she'll need further prolonged treatment and observation.  Plan    The patient was transferred to the ED via shuttle in stable condition.  Medical Decision Making:  28 year old female  with asthma has a 2 day history of an exacerbation associated with a flu like illness.  She was seen and treated in the office by her PCP this morning, and was sent for a CXR which was normal.  She got worse en route and came here.  After a Duo Neb breathing treatment and Depo Medol 120 mg she feels no better and still has inspiratory and expiratory wheezes.  She will need prolonged treatment and observaation.         Harden Mo, MD 01/30/14 [id number]

## 2014-01-30 NOTE — Progress Notes (Signed)
Pre visit review using our clinic review tool, if applicable. No additional management support is needed unless otherwise documented below in the visit note.  Chief Complaint  Patient presents with  . Fatigue    Started on Monday.  Taking Advil 800 mg to help with fever and body aches.  . Generalized Body Aches  . Headache  . Shortness of Breath  . Fever  . Chills  . Cough    HPI: Patient Diana Mccormick  comes in today for SDA for  new problem evaluation. 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 Then asthma sx .  Fever yesterday over 101   Myalgias achiness malaise and asthma flare  albuteral neb  Not helping and fduoneb. Not that helpful  Asthma flaring  Last neb 7 am  pheg[facility]  Not on controller med but asthma flares with colds  Face pain better today congested  Some chest pain in back left  Tobacco 1 -2 day.  ROS: See pertinent positives and negatives per HPI. No vomiting diarrhea  Rash  hemoptysis   Past Medical History  Diagnosis Date  . Asthma     No family history on file.  History   Social History  . Marital Status: Single    Spouse Name: N/A    Number of Children: N/A  . Years of Education: N/A   Social History Main Topics  . Smoking status: Light Tobacco Smoker  . Smokeless tobacco: Never Used  . Alcohol Use: Yes  . Drug Use: No  . Sexual Activity: Not on file   Other Topics Concern  . Not on file   Social History Narrative  . No narrative on file    Outpatient Encounter Prescriptions as of 01/30/2014  Medication Sig  . albuterol (PROVENTIL HFA;VENTOLIN HFA) 108 (90 BASE) MCG/ACT inhaler Inhale 2 puffs into the lungs every 6 (six) hours as needed for wheezing.  [name] albuterol (PROVENTIL) (2.5 MG/3ML) 0.083% nebulizer solution Take 3 mLs (2.5 mg total) by nebulization every 4 (four) hours as needed for wheezing.  [name]  escitalopram (LEXAPRO) 10 MG tablet Take 1 tablet (10 mg total) by mouth daily.  [name] ipratropium-albuterol (DUONEB) 0.5-2.5 (3) MG/3ML SOLN Take 3 mLs by nebulization every 6 (six) hours as needed.  . loratadine (CLARITIN) 10 MG tablet Take 10 mg by mouth daily.  [name] LORazepam (ATIVAN) 0.5 MG tablet Take 1 tablet (0.5 mg total) by mouth 2 (two) times daily as needed for anxiety.  [name] oseltamivir (TAMIFLU) 75 MG capsule Take 1 capsule (75 mg total) by mouth 2 (two) times daily.  . predniSONE (DELTASONE) 20 MG tablet Take 3 po qd for 2 days then 2 po qd for 3 days,or as directed  . [DISCONTINUED] fexofenadine (ALLEGRA) 180 MG tablet Take 1 tablet (180 mg total) by mouth daily.  . [DISCONTINUED] PROAIR HFA 108 (90 BASE) MCG/ACT inhaler INHALE 2 PUFFS BY MOUTH INTO THE LUNGS EVERY 6 HOURS AS NEEDED FOR WHEEZING  . albuterol (PROVENTIL) (2.5 MG/3ML) 0.083% nebulizer solution 2.5 mg   . [DISCONTINUED] varicella virus vaccine (live) injection 0.5 mL     EXAM:  BP 120/62  Pulse 102  Temp(Src) 98.8 F (37.1 C) (Oral)  Wt 183 lb (83.008 kg)  SpO2 96%  Body mass index is 29.98 kg/(m^2). WDWN in NAD  quiet respirations; congested  somewhat hoarse.  Non toxic . Some inc rr nl color  Speech nl  HEENT: Normocephalic ;atraumatic , Eyes;  PERRL, EOMs  Full, lids and conjunctiva clear,,Ears: no deformities, canals nl, TM landmarks normal, left scar Nose: no deformity clear dc  but congested;face non  tender Mouth : OP clear without lesion or edema . Neck: Supple without adenopathy or masses or bruits Chest:  Tight wheezing bilaterally dec bs cannot tell if  assymmetrical  CV:  S1-S2 no gallops or murmurs peripheral perfusion is normal Skin :nl perfusion and no acute rashes  abd no masses  PSYCH: pleasant and cooperative, no obvious depression or anxiety  ASSESSMENT AND PLAN:  Discussed the following assessment and plan:  Flu-like symptoms - Plan: POCT Influenza A/B, DG Chest 2 View  Asthma with acute  exacerbation, unspecified asthma severity - Plan: POCT Influenza A/B, DG Chest 2 View, albuterol (PROVENTIL) (2.5 MG/3ML) 0.083% nebulizer solution 2.5 mg  Tobacco use - minimal but daily  advise cessation  Asthmatic bronchitis .  Patient mild to moderately ill seem to respond to in house nebulizer with oxygen based on clinical exam Chest x-ray to be obtained negative for pneumonia although negative nasal swab for flu we have now seen at documented in the community she is exposed and is only had her flu vaccine a week ago. And high risk. Prednisone treatment for her asthma Tamiflu( fever less  Than 48- 72 hours)Than  close observation rest followup with primary care for asthma management she probably should be on controller medicine during the flu and cold season. She needs to. Her smoking albeit minimal. -Patient advised to return or notify health care team  if symptoms worsen ,persist or new concerns arise.  Patient Instructions  This acts liek flu but concern about poss pna  With your asthma flare  Chest x ray  pred and antibiotic  Close fu next week PCP.  Need to discuss controller medication for asthma and stop tobacco al together .    Standley Brooking. Bunnie Rehberg M.D.

## 2014-01-30 NOTE — Discharge Instructions (Signed)
We have determined that your problem requires further evaluation in the emergency department.  We will take care of your transport there.  Once at the emergency department, you will be evaluated by a provider and they will order whatever treatment or tests they deem necessary.  We cannot guarantee that they will do any specific test or do any specific treatment.  ° °

## 2014-01-30 NOTE — ED Notes (Signed)
Pt c/o increased trouble with asthma; pt sts neb without relief at [facility] and was sent here

## 2014-01-30 NOTE — Discharge Instructions (Signed)
Please call your doctor for a followup appointment within 24-48 hours. When you talk to your doctor please let them know that you were seen in the emergency department and have them acquire all of your records so that they can discuss the findings with you and formulate a treatment plan to fully care for your new and ongoing problems. ° °

## 2014-01-30 NOTE — ED Notes (Signed)
Ambulated pt in hallway. O2 levels ranged from 92%-96%

## 2014-01-31 ENCOUNTER — Telehealth: Payer: Self-pay | Admitting: Internal Medicine

## 2014-01-31 NOTE — Telephone Encounter (Signed)
emmi mailed  °

## 2014-02-07 ENCOUNTER — Ambulatory Visit (INDEPENDENT_AMBULATORY_CARE_PROVIDER_SITE_OTHER): Payer: 59 | Admitting: Licensed Clinical Social Worker

## 2014-02-07 DIAGNOSIS — F411 Generalized anxiety disorder: Secondary | ICD-10-CM

## 2014-02-28 ENCOUNTER — Ambulatory Visit: Payer: 59 | Admitting: Licensed Clinical Social Worker

## 2014-02-28 ENCOUNTER — Ambulatory Visit (INDEPENDENT_AMBULATORY_CARE_PROVIDER_SITE_OTHER): Payer: 59 | Admitting: Licensed Clinical Social Worker

## 2014-02-28 DIAGNOSIS — F411 Generalized anxiety disorder: Secondary | ICD-10-CM

## 2014-05-08 ENCOUNTER — Ambulatory Visit (INDEPENDENT_AMBULATORY_CARE_PROVIDER_SITE_OTHER): Payer: 59 | Admitting: Internal Medicine

## 2014-05-08 ENCOUNTER — Encounter: Payer: Self-pay | Admitting: Internal Medicine

## 2014-05-08 ENCOUNTER — Encounter: Payer: Self-pay | Admitting: *Deleted

## 2014-05-08 VITALS — BP 120/70 | HR 85 | Temp 98.0°F | Resp 20 | Ht 65.0 in | Wt 178.0 lb

## 2014-05-08 DIAGNOSIS — J4520 Mild intermittent asthma, uncomplicated: Secondary | ICD-10-CM

## 2014-05-08 NOTE — Patient Instructions (Signed)
Lexapro 5 mg (1/2 tablet) every day for 2 weeks, then 5 mg every other day for 2 weeks and then discontinue    It is important that you exercise regularly, at least 20 minutes 3 to 4 times per week.  If you develop chest pain or shortness of breath seek  medical attention.

## 2014-05-08 NOTE — Progress Notes (Signed)
Subjective:    Patient ID: Diana Mccormick, female    DOB: 12/14/1985, 29 y.o.   MRN: [id number]  HPI  29 year old patient who has asthma.  She discontinued tobacco products in October of last year and has done quite well. She has a history of situational depression and anxiety and has been on Lexapro.  She has recently attempted to lose weight and has been followed at a bariatric clinic.  She wishes to discontinue Lexapro and switch to Wellbutrin due to weight concerns.  Clinically the patient is doing quite well  Past Medical History  Diagnosis Date  . Asthma     History   Social History  . Marital Status: Single    Spouse Name: N/A    Number of Children: N/A  . Years of Education: N/A   Occupational History  . Not on file.   Social History Main Topics  . Smoking status: Former Smoker    Quit date: 02/05/2014  . Smokeless tobacco: Never Used  . Alcohol Use: 0.0 oz/week    0 Not specified per week  . Drug Use: No  . Sexual Activity: Not on file   Other Topics Concern  . Not on file   Social History Narrative    No past surgical history on file.  No family history on file.  No Known Allergies  Current Outpatient Prescriptions on File Prior to Visit  Medication Sig Dispense Refill  . albuterol (PROVENTIL HFA;VENTOLIN HFA) 108 (90 BASE) MCG/ACT inhaler Inhale 2 puffs into the lungs every 6 (six) hours as needed for wheezing. 1 Inhaler 6  . albuterol (PROVENTIL HFA;VENTOLIN HFA) 108 (90 BASE) MCG/ACT inhaler Inhale 2 puffs into the lungs every 4 (four) hours as needed for wheezing or shortness of breath. 1 Inhaler 3  . albuterol (PROVENTIL) (2.5 MG/3ML) 0.083% nebulizer solution Take 3 mLs (2.5 mg total) by nebulization every 4 (four) hours as needed for wheezing. 75 mL 12  . Cranberry-Vitamin C-Probiotic (AZO CRANBERRY PO) Take 1 tablet by mouth daily as needed (for pain).    [name] escitalopram (LEXAPRO) 10 MG tablet Take 1 tablet (10 mg total) by mouth daily. 90  tablet 1  . ibuprofen (ADVIL,MOTRIN) 200 MG tablet Take 800 mg by mouth every 6 (six) hours as needed for moderate pain.    [name] ipratropium-albuterol (DUONEB) 0.5-2.5 (3) MG/3ML SOLN Take 3 mLs by nebulization every 6 (six) hours as needed. 360 mL 1  . loratadine (CLARITIN) 10 MG tablet Take 10 mg by mouth daily.    . Multiple Vitamin (MULTIVITAMIN WITH MINERALS) TABS tablet Take 1 tablet by mouth daily.    . naproxen sodium (ANAPROX) 220 MG tablet Take 220 mg by mouth 2 (two) times daily as needed.    . Probiotic Product (PROBIOTIC PO) Take 1 capsule by mouth daily.     No current facility-administered medications on file prior to visit.    BP 120/70 mmHg  Pulse 85  Temp(Src) 98 F (36.7 C) (Oral)  Resp 20  Ht 5\' 5"  (1.651 m)  Wt 178 lb (80.74 kg)  BMI 29.62 kg/m2  SpO2 98%     Review of Systems  Constitutional: Positive for unexpected weight change.  HENT: Negative for congestion, dental problem, hearing loss, rhinorrhea, sinus pressure, sore throat and tinnitus.   Eyes: Negative for pain, discharge and visual disturbance.  Respiratory: Negative for cough and shortness of breath.   Cardiovascular: Negative for chest pain, palpitations and leg swelling.  Gastrointestinal: Negative for nausea,  vomiting, abdominal pain, diarrhea, constipation, blood in stool and abdominal distention.  Genitourinary: Negative for dysuria, urgency, frequency, hematuria, flank pain, vaginal bleeding, vaginal discharge, difficulty urinating, vaginal pain and pelvic pain.  Musculoskeletal: Negative for joint swelling, arthralgias and gait problem.  Skin: Negative for rash.  Neurological: Negative for dizziness, syncope, speech difficulty, weakness, numbness and headaches.  Hematological: Negative for adenopathy.  Psychiatric/Behavioral: Negative for behavioral problems, dysphoric mood and agitation. The patient is not nervous/anxious.        Objective:   Physical Exam  Constitutional: She appears  well-developed and well-nourished. No distress.  Blood pressure well controlled at 120 over 70 Weight 178  Pulmonary/Chest: Effort normal and breath sounds normal. No respiratory distress. She has no wheezes. She has no rales.          Assessment & Plan:   Asthma, stable History tobacco use.  Smoking cessation discontinued October 2015 History of situational depression and anxiety.  Options discussed.  We'll taper and discontinue Lexapro and observe off medications at this time

## 2014-05-08 NOTE — Progress Notes (Signed)
Pre visit review using our clinic review tool, if applicable. No additional management support is needed unless otherwise documented below in the visit note. 

## 2014-09-25 ENCOUNTER — Other Ambulatory Visit: Payer: Self-pay | Admitting: Internal Medicine

## 2014-09-25 ENCOUNTER — Telehealth: Payer: Self-pay | Admitting: Internal Medicine

## 2014-09-25 MED ORDER — ALBUTEROL SULFATE HFA 108 (90 BASE) MCG/ACT IN AERS: 2.0000 | INHALATION_SPRAY | RESPIRATORY_TRACT | Status: DC | PRN

## 2014-09-25 NOTE — Telephone Encounter (Signed)
Pt request refill of the following: albuterol (PROVENTIL HFA;VENTOLIN HFA) 108 (90 BASE) MCG/ACT inhaler   Phamacy: [facility]

## 2014-09-25 NOTE — Telephone Encounter (Signed)
Rx sent to pharmacy   

## 2014-10-23 ENCOUNTER — Emergency Department (HOSPITAL_BASED_OUTPATIENT_CLINIC_OR_DEPARTMENT_OTHER)
Admission: EM | Admit: 2014-10-23 | Discharge: 2014-10-23 | Disposition: A | Discharge status: Home / Self Care | Payer: 59 | Attending: Emergency Medicine | Admitting: Emergency Medicine

## 2014-10-23 ENCOUNTER — Emergency Department (HOSPITAL_BASED_OUTPATIENT_CLINIC_OR_DEPARTMENT_OTHER): Payer: 59

## 2014-10-23 ENCOUNTER — Encounter (HOSPITAL_BASED_OUTPATIENT_CLINIC_OR_DEPARTMENT_OTHER): Payer: Self-pay | Admitting: *Deleted

## 2014-10-23 DIAGNOSIS — Z87891 Personal history of nicotine dependence: Secondary | ICD-10-CM | POA: Diagnosis not present

## 2014-10-23 DIAGNOSIS — R110 Nausea: Secondary | ICD-10-CM | POA: Insufficient documentation

## 2014-10-23 DIAGNOSIS — Z8679 Personal history of other diseases of the circulatory system: Secondary | ICD-10-CM | POA: Diagnosis not present

## 2014-10-23 DIAGNOSIS — Z79899 Other long term (current) drug therapy: Secondary | ICD-10-CM | POA: Insufficient documentation

## 2014-10-23 DIAGNOSIS — J45909 Unspecified asthma, uncomplicated: Secondary | ICD-10-CM | POA: Diagnosis not present

## 2014-10-23 DIAGNOSIS — R51 Headache: Secondary | ICD-10-CM | POA: Diagnosis not present

## 2014-10-23 DIAGNOSIS — M542 Cervicalgia: Secondary | ICD-10-CM | POA: Insufficient documentation

## 2014-10-23 HISTORY — DX: Migraine, unspecified, not intractable, without status migrainosus: G43.909

## 2014-10-23 NOTE — ED Notes (Signed)
Pt c/o neck pain x 2 hrs denies injury

## 2014-10-23 NOTE — ED Provider Notes (Signed)
CSN: [id number]     Arrival date & time 10/23/14  0002 History   First MD Initiated Contact with Patient 10/23/14 0115     Chief Complaint  Patient presents with  . Neck Pain     (Consider location/radiation/quality/duration/timing/severity/associated sxs/prior Treatment) HPI  This is a 29 year old female had the fairly sudden onset of neck pain about 3 hours ago. The pain is located in the back of the neck. She has subsequently developed a generalized headache. The headache is dull and pressure-like and similar to previous migraines but without receiving visual aura. Her neck pain is worse with movement of her neck. It is not worse with palpation. She has been nauseated but has not vomited. She has not had a fever. She has not had a sore throat. She denies any focal neurologic changes.  Past Medical History  Diagnosis Date  . Asthma   . Migraine    History reviewed. No pertinent past surgical history. History reviewed. No pertinent family history. History  Substance Use Topics  . Smoking status: Former Smoker    Quit date: 02/05/2014  . Smokeless tobacco: Never Used  . Alcohol Use: 0.0 oz/week    0 Standard drinks or equivalent per week   OB History    No data available     Review of Systems  All other systems reviewed and are negative.   Allergies  Review of patient's allergies indicates no known allergies.  Home Medications   Prior to Admission medications   Medication Sig Start Date End Date Taking? Authorizing Provider  albuterol (PROVENTIL HFA;VENTOLIN HFA) 108 (90 BASE) MCG/ACT inhaler Inhale 2 puffs into the lungs every 4 (four) hours as needed for wheezing or shortness of breath. 09/25/14   Marletta Lor, MD  albuterol (PROVENTIL) (2.5 MG/3ML) 0.083% nebulizer solution Take 3 mLs (2.5 mg total) by nebulization every 4 (four) hours as needed for wheezing. 08/29/13   Marletta Lor, MD  Cranberry-Vitamin C-Probiotic (AZO CRANBERRY PO) Take 1 tablet by  mouth daily as needed (for pain).    Historical Provider, MD  ibuprofen (ADVIL,MOTRIN) 200 MG tablet Take 800 mg by mouth every 6 (six) hours as needed for moderate pain.    Historical Provider, MD  ipratropium-albuterol (DUONEB) 0.5-2.5 (3) MG/3ML SOLN Take 3 mLs by nebulization every 6 (six) hours as needed. 12/23/12   Freeman Caldron Baker, PA-C  loratadine (CLARITIN) 10 MG tablet Take 10 mg by mouth daily.    Historical Provider, MD  Multiple Vitamin (MULTIVITAMIN WITH MINERALS) TABS tablet Take 1 tablet by mouth daily.    Historical Provider, MD  naproxen sodium (ANAPROX) 220 MG tablet Take 220 mg by mouth 2 (two) times daily as needed.    Historical Provider, MD  PHENDIMETRAZINE TARTRATE CR PO Take 1 tablet by mouth 2 (two) times daily.    Historical Provider, MD  Probiotic Product (PROBIOTIC PO) Take 1 capsule by mouth daily.    Historical Provider, MD  VENTOLIN HFA 108 (90 BASE) MCG/ACT inhaler INHALE 2 PUFFS INTO THE LUNGS EVERY 6 HOURS AS NEEDED FOR WHEEZING. 09/25/14   Marletta Lor, MD   BP 110/60 mmHg  Pulse 86  Temp(Src) 98.7 F (37.1 C) (Oral)  Resp 16  Ht 5\' 6"  (1.676 m)  Wt 167 lb (75.751 kg)  BMI 26.97 kg/m2  SpO2 99%  LMP 09/28/2014   Physical Exam  General: Well-developed, well-nourished female in no acute distress; appearance consistent with age of record HENT: normocephalic; atraumatic Eyes: pupils equal, round  and reactive to light; extraocular muscles intact Neck: supple; no meningeal signs; nontender Heart: regular rate and rhythm Lungs: clear to auscultation bilaterally Abdomen: soft; nondistended; nontender; no masses or hepatosplenomegaly; bowel sounds present Extremities: No deformity; full range of motion; pulses normal Neurologic: Awake, alert and oriented; motor function intact in all extremities and symmetric; no facial droop Skin: Warm and dry Psychiatric: Flat affect    ED Course  Procedures (including critical care time)   MDM  Nursing notes  and vitals signs, including pulse oximetry, reviewed.  Summary of this visit's results, reviewed by myself:  Imaging Studies: Ct Head Wo Contrast  10/23/2014   CLINICAL DATA:  Headache, neck pain and stiffness for 2 hours. No injury.  EXAM: CT HEAD WITHOUT CONTRAST  TECHNIQUE: Contiguous axial images were obtained from the base of the skull through the vertex without intravenous contrast.  COMPARISON:  None.  FINDINGS: Ventricles and sulci are symmetrical. No mass effect or midline shift. No abnormal extra-axial fluid collections. Gray-white matter junctions are distinct. Basal cisterns are not effaced. No evidence of acute intracranial hemorrhage. No depressed skull fractures. Visualized paranasal sinuses and mastoid air cells are not opacified.  IMPRESSION: No acute intracranial abnormalities.   Electronically Signed   By: Lucienne Capers M.D.   On: 10/23/2014 01:54   1:59 AM The patient does not desire any medications in the ED. She was primarily concerned about subarachnoid hemorrhage and her CT was reassuring. She has no meningeal signs. 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 Suspect symptoms are due to atypical migraine.     Shanon Rosser, MD 10/23/14 0200

## 2015-01-12 ENCOUNTER — Encounter (HOSPITAL_BASED_OUTPATIENT_CLINIC_OR_DEPARTMENT_OTHER): Payer: Self-pay

## 2015-01-12 ENCOUNTER — Inpatient Hospital Stay (HOSPITAL_BASED_OUTPATIENT_CLINIC_OR_DEPARTMENT_OTHER)
Admission: EM | Admit: 2015-01-12 | Discharge: 2015-01-14 | DRG: 872 | Disposition: A | Discharge status: Home / Self Care | Payer: 59 | Attending: Internal Medicine | Admitting: Internal Medicine

## 2015-01-12 ENCOUNTER — Emergency Department (HOSPITAL_BASED_OUTPATIENT_CLINIC_OR_DEPARTMENT_OTHER): Payer: 59

## 2015-01-12 DIAGNOSIS — I959 Hypotension, unspecified: Secondary | ICD-10-CM | POA: Diagnosis present

## 2015-01-12 DIAGNOSIS — E869 Volume depletion, unspecified: Secondary | ICD-10-CM | POA: Diagnosis present

## 2015-01-12 DIAGNOSIS — B373 Candidiasis of vulva and vagina: Secondary | ICD-10-CM | POA: Diagnosis present

## 2015-01-12 DIAGNOSIS — Z87891 Personal history of nicotine dependence: Secondary | ICD-10-CM | POA: Diagnosis not present

## 2015-01-12 DIAGNOSIS — N7093 Salpingitis and oophoritis, unspecified: Secondary | ICD-10-CM | POA: Diagnosis present

## 2015-01-12 DIAGNOSIS — E872 Acidosis: Secondary | ICD-10-CM | POA: Diagnosis present

## 2015-01-12 DIAGNOSIS — N83201 Unspecified ovarian cyst, right side: Secondary | ICD-10-CM | POA: Diagnosis present

## 2015-01-12 DIAGNOSIS — G4700 Insomnia, unspecified: Secondary | ICD-10-CM | POA: Diagnosis present

## 2015-01-12 DIAGNOSIS — J4530 Mild persistent asthma, uncomplicated: Secondary | ICD-10-CM | POA: Diagnosis not present

## 2015-01-12 DIAGNOSIS — A419 Sepsis, unspecified organism: Principal | ICD-10-CM | POA: Diagnosis present

## 2015-01-12 DIAGNOSIS — J45909 Unspecified asthma, uncomplicated: Secondary | ICD-10-CM | POA: Diagnosis present

## 2015-01-12 DIAGNOSIS — Z975 Presence of (intrauterine) contraceptive device: Secondary | ICD-10-CM | POA: Diagnosis not present

## 2015-01-12 DIAGNOSIS — E669 Obesity, unspecified: Secondary | ICD-10-CM | POA: Diagnosis not present

## 2015-01-12 DIAGNOSIS — N760 Acute vaginitis: Secondary | ICD-10-CM | POA: Diagnosis present

## 2015-01-12 DIAGNOSIS — J4520 Mild intermittent asthma, uncomplicated: Secondary | ICD-10-CM

## 2015-01-12 DIAGNOSIS — E8809 Other disorders of plasma-protein metabolism, not elsewhere classified: Secondary | ICD-10-CM | POA: Diagnosis present

## 2015-01-12 DIAGNOSIS — N898 Other specified noninflammatory disorders of vagina: Secondary | ICD-10-CM

## 2015-01-12 DIAGNOSIS — R6510 Systemic inflammatory response syndrome (SIRS) of non-infectious origin without acute organ dysfunction: Secondary | ICD-10-CM | POA: Insufficient documentation

## 2015-01-12 DIAGNOSIS — R509 Fever, unspecified: Secondary | ICD-10-CM

## 2015-01-12 DIAGNOSIS — Z8249 Family history of ischemic heart disease and other diseases of the circulatory system: Secondary | ICD-10-CM

## 2015-01-12 LAB — COMPREHENSIVE METABOLIC PANEL
ALT: 20 U/L (ref 14–54)
AST: 32 U/L (ref 15–41)
Albumin: 3.8 g/dL (ref 3.5–5.0)
Alkaline Phosphatase: 38 U/L (ref 38–126)
Anion gap: 8 (ref 5–15)
BUN: 14 mg/dL (ref 6–20)
CO2: 21 mmol/L — ABNORMAL LOW (ref 22–32)
Calcium: 8.7 mg/dL — ABNORMAL LOW (ref 8.9–10.3)
Chloride: 107 mmol/L (ref 101–111)
Creatinine, Ser: 0.79 mg/dL (ref 0.44–1.00)
GFR calc Af Amer: >60 mL/min (ref >60)
GFR calc non Af Amer: >60 mL/min (ref >60)
Glucose, Bld: 87 mg/dL (ref 65–99)
Potassium: 3.3 mmol/L — ABNORMAL LOW (ref 3.5–5.1)
Sodium: 136 mmol/L (ref 135–145)
Total Bilirubin: 0.3 mg/dL (ref 0.3–1.2)
Total Protein: 7 g/dL (ref 6.5–8.1)

## 2015-01-12 LAB — CBC WITH DIFFERENTIAL/PLATELET
Basophils Absolute: 0 10*3/uL (ref 0.0–0.1)
Basophils Relative: 0 %
Eosinophils Absolute: 0 10*3/uL (ref 0.0–0.7)
Eosinophils Relative: 0 %
HCT: 36.1 % (ref 36.0–46.0)
Hemoglobin: 11.9 g/dL — ABNORMAL LOW (ref 12.0–15.0)
Lymphocytes Relative: 6 %
Lymphs Abs: 0.3 10*3/uL — ABNORMAL LOW (ref 0.7–4.0)
MCH: 28 pg (ref 26.0–34.0)
MCHC: 33 g/dL (ref 30.0–36.0)
MCV: 84.9 fL (ref 78.0–100.0)
Monocytes Absolute: 0 10*3/uL — ABNORMAL LOW (ref 0.1–1.0)
Monocytes Relative: 0 %
Neutro Abs: 5.1 10*3/uL (ref 1.7–7.7)
Neutrophils Relative %: 94 %
Platelets: 220 10*3/uL (ref 150–400)
RBC: 4.25 MIL/uL (ref 3.87–5.11)
RDW: 12.9 % (ref 11.5–15.5)
WBC: 5.4 10*3/uL (ref 4.0–10.5)

## 2015-01-12 LAB — PROCALCITONIN: Procalcitonin: 10.23 ng/mL

## 2015-01-12 LAB — RAPID HIV SCREEN (HIV 1/2 AB+AG)
HIV 1/2 ANTIBODIES: NONREACTIVE
HIV-1 P24 ANTIGEN - HIV24: NONREACTIVE

## 2015-01-12 LAB — URINALYSIS, ROUTINE W REFLEX MICROSCOPIC
Bilirubin Urine: NEGATIVE
Glucose, UA: NEGATIVE mg/dL
Hgb urine dipstick: NEGATIVE
Ketones, ur: NEGATIVE mg/dL
NITRITE: NEGATIVE
PH: 5 (ref 5.0–8.0)
Protein, ur: NEGATIVE mg/dL
SPECIFIC GRAVITY, URINE: 1.018 (ref 1.005–1.030)
UROBILINOGEN UA: 0.2 mg/dL (ref 0.0–1.0)

## 2015-01-12 LAB — CBC
HEMATOCRIT: 35.6 % — AB (ref 36.0–46.0)
HEMOGLOBIN: 11.6 g/dL — AB (ref 12.0–15.0)
MCH: 27.8 pg (ref 26.0–34.0)
MCHC: 32.6 g/dL (ref 30.0–36.0)
MCV: 85.2 fL (ref 78.0–100.0)
Platelets: 201 10*3/uL (ref 150–400)
RBC: 4.18 MIL/uL (ref 3.87–5.11)
RDW: 13.3 % (ref 11.5–15.5)
WBC: 10.9 10*3/uL — ABNORMAL HIGH (ref 4.0–10.5)

## 2015-01-12 LAB — INFLUENZA PANEL BY PCR (TYPE A & B)
H1N1 flu by pcr: NOT DETECTED
Influenza A By PCR: NEGATIVE
Influenza B By PCR: NEGATIVE

## 2015-01-12 LAB — I-STAT CG4 LACTIC ACID, ED
Lactic Acid, Venous: 2.11 mmol/L (ref 0.5–2.0)
Lactic Acid, Venous: 0.88 mmol/L (ref 0.5–2.0)

## 2015-01-12 LAB — PREGNANCY, URINE: PREG TEST UR: NEGATIVE

## 2015-01-12 LAB — CREATININE, SERUM
Creatinine, Ser: 1 mg/dL (ref 0.44–1.00)
GFR calc Af Amer: >60 mL/min (ref >60)
GFR calc non Af Amer: >60 mL/min (ref >60)

## 2015-01-12 LAB — MRSA PCR SCREENING: MRSA BY PCR: NEGATIVE

## 2015-01-12 LAB — URINE MICROSCOPIC-ADD ON

## 2015-01-12 LAB — RAPID STREP SCREEN (MED CTR MEBANE ONLY): STREPTOCOCCUS, GROUP A SCREEN (DIRECT): NEGATIVE

## 2015-01-12 LAB — TROPONIN I

## 2015-01-12 MED ORDER — ACETAMINOPHEN 325 MG PO TABS
650.0000 mg | ORAL_TABLET | Freq: Four times a day (QID) | ORAL | Status: DC | PRN
  Filled 2015-01-12: qty 2

## 2015-01-12 MED ORDER — MELATONIN 5 MG PO TABS: 5.0000 mg | ORAL_TABLET | Freq: Every day | ORAL | Status: DC

## 2015-01-12 MED ORDER — DEXTROSE 5 % IV SOLN
1.0000 g | Freq: Once | INTRAVENOUS | Status: AC
  Administered 2015-01-12: 1 g via INTRAVENOUS

## 2015-01-12 MED ORDER — DEXTROSE 5 % IV SOLN
1.0000 g | INTRAVENOUS | Status: DC
  Filled 2015-01-12: qty 10

## 2015-01-12 MED ORDER — IBUPROFEN 200 MG PO TABS
600.0000 mg | ORAL_TABLET | Freq: Four times a day (QID) | ORAL | Status: DC | PRN
  Administered 2015-01-12 – 2015-01-14 (×4): 600 mg via ORAL
  Filled 2015-01-12 (×4): qty 3

## 2015-01-12 MED ORDER — POTASSIUM CHLORIDE 10 MEQ/100ML IV SOLN
10.0000 meq | Freq: Once | INTRAVENOUS | Status: AC
  Administered 2015-01-12: 10 meq via INTRAVENOUS
  Filled 2015-01-12: qty 100

## 2015-01-12 MED ORDER — SODIUM CHLORIDE 0.9 % IJ SOLN
3.0000 mL | Freq: Two times a day (BID) | INTRAMUSCULAR | Status: DC
  Administered 2015-01-12 – 2015-01-13 (×2): 3 mL via INTRAVENOUS

## 2015-01-12 MED ORDER — LORATADINE 10 MG PO TABS
10.0000 mg | ORAL_TABLET | Freq: Every day | ORAL | Status: DC
  Administered 2015-01-12 – 2015-01-14 (×3): 10 mg via ORAL
  Filled 2015-01-12 (×3): qty 1

## 2015-01-12 MED ORDER — IPRATROPIUM-ALBUTEROL 0.5-2.5 (3) MG/3ML IN SOLN
3.0000 mL | Freq: Once | RESPIRATORY_TRACT | Status: AC
  Administered 2015-01-13: 3 mL via RESPIRATORY_TRACT
  Filled 2015-01-12: qty 3

## 2015-01-12 MED ORDER — ACETAMINOPHEN 650 MG RE SUPP: 650.0000 mg | Freq: Four times a day (QID) | RECTAL | Status: DC | PRN

## 2015-01-12 MED ORDER — ONDANSETRON HCL 4 MG/2ML IJ SOLN
4.0000 mg | Freq: Four times a day (QID) | INTRAMUSCULAR | Status: DC | PRN
  Administered 2015-01-12: 4 mg via INTRAVENOUS
  Filled 2015-01-12: qty 2

## 2015-01-12 MED ORDER — PHENOL 1.4 % MT LIQD: 1.0000 | OROMUCOSAL | Status: DC | PRN

## 2015-01-12 MED ORDER — FLUCONAZOLE 150 MG PO TABS
150.0000 mg | ORAL_TABLET | ORAL | Status: DC
  Administered 2015-01-12: 150 mg via ORAL
  Filled 2015-01-12 (×2): qty 1

## 2015-01-12 MED ORDER — GUAIFENESIN ER 600 MG PO TB12: 1200.0000 mg | ORAL_TABLET | Freq: Two times a day (BID) | ORAL | Status: DC | PRN

## 2015-01-12 MED ORDER — IBUPROFEN 200 MG PO TABS
400.0000 mg | ORAL_TABLET | Freq: Once | ORAL | Status: AC
  Administered 2015-01-12: 400 mg via ORAL
  Filled 2015-01-12: qty 2

## 2015-01-12 MED ORDER — SODIUM CHLORIDE 0.9 % IV BOLUS (SEPSIS)
1000.0000 mL | INTRAVENOUS | Status: AC
Start: 2015-01-12 — End: 2015-01-12
  Administered 2015-01-12: 1000 mL via INTRAVENOUS

## 2015-01-12 MED ORDER — ONDANSETRON HCL 4 MG PO TABS
4.0000 mg | ORAL_TABLET | Freq: Four times a day (QID) | ORAL | Status: DC | PRN
  Administered 2015-01-13: 4 mg via ORAL
  Filled 2015-01-12: qty 1

## 2015-01-12 MED ORDER — SODIUM CHLORIDE 0.9 % IV BOLUS (SEPSIS)
1000.0000 mL | Freq: Once | INTRAVENOUS | Status: AC
  Administered 2015-01-12: 1000 mL via INTRAVENOUS

## 2015-01-12 MED ORDER — OXYCODONE HCL 5 MG PO TABS: 5.0000 mg | ORAL_TABLET | ORAL | Status: DC | PRN

## 2015-01-12 MED ORDER — SODIUM CHLORIDE 0.9 % IV BOLUS (SEPSIS)
1000.0000 mL | INTRAVENOUS | Status: AC
  Administered 2015-01-12: 1000 mL via INTRAVENOUS

## 2015-01-12 MED ORDER — IBUPROFEN 800 MG PO TABS
800.0000 mg | ORAL_TABLET | Freq: Once | ORAL | Status: AC
  Administered 2015-01-12: 800 mg via ORAL
  Filled 2015-01-12: qty 1

## 2015-01-12 MED ORDER — MELATONIN 3 MG PO TABS
3.0000 mg | ORAL_TABLET | Freq: Every day | ORAL | Status: DC
  Administered 2015-01-12: 3 mg via ORAL
  Filled 2015-01-12 (×4): qty 1

## 2015-01-12 MED ORDER — CEFTRIAXONE SODIUM 1 G IJ SOLR
INTRAMUSCULAR | Status: AC
  Filled 2015-01-12: qty 10

## 2015-01-12 MED ORDER — DEXTROSE 5 % IV SOLN
1.0000 g | INTRAVENOUS | Status: DC
  Administered 2015-01-13: 1 g via INTRAVENOUS
  Filled 2015-01-12 (×2): qty 10

## 2015-01-12 MED ORDER — IPRATROPIUM-ALBUTEROL 0.5-2.5 (3) MG/3ML IN SOLN
3.0000 mL | Freq: Four times a day (QID) | RESPIRATORY_TRACT | Status: DC
  Administered 2015-01-12 – 2015-01-13 (×4): 3 mL via RESPIRATORY_TRACT
  Filled 2015-01-12 (×5): qty 3

## 2015-01-12 MED ORDER — SODIUM CHLORIDE 0.9 % IV BOLUS (SEPSIS)
500.0000 mL | INTRAVENOUS | Status: AC
Start: 2015-01-12 — End: 2015-01-12
  Administered 2015-01-12: 500 mL via INTRAVENOUS

## 2015-01-12 MED ORDER — ENOXAPARIN SODIUM 40 MG/0.4ML ~~LOC~~ SOLN
40.0000 mg | SUBCUTANEOUS | Status: DC
  Administered 2015-01-12 – 2015-01-13 (×2): 40 mg via SUBCUTANEOUS
  Filled 2015-01-12 (×2): qty 0.4

## 2015-01-12 MED ORDER — SODIUM CHLORIDE 0.9 % IV SOLN
INTRAVENOUS | Status: DC
  Administered 2015-01-12 – 2015-01-14 (×3): via INTRAVENOUS

## 2015-01-12 MED ORDER — SODIUM CHLORIDE 0.9 % IV BOLUS (SEPSIS)
500.0000 mL | INTRAVENOUS | Status: AC
  Administered 2015-01-12: 500 mL via INTRAVENOUS

## 2015-01-12 NOTE — ED Notes (Signed)
2nd lactic acid deferred by PA.

## 2015-01-12 NOTE — ED Notes (Signed)
Report called to [facility] on 2600.

## 2015-01-12 NOTE — ED Notes (Signed)
PA at bedside.

## 2015-01-12 NOTE — Progress Notes (Signed)
NP made aware that pts BP is 82/38 (48) map. After 1563ml bolus.

## 2015-01-12 NOTE — ED Notes (Signed)
Pt in XRAY 

## 2015-01-12 NOTE — Progress Notes (Signed)
Pharmacy Antibiotic Progress Note  28 YOF with rapid onset of generalized body aches, fevers, and dysuria with back pain to start on IV ceftriaxone for empiric treatment of UTI. WBC wnl, LA 2.11 on admission. Start ceftriaxone 1 gm IV Q 24 hours. Pharmacy to sign off since no further antibiotic dose adjustments needed.   Albertina Parr, PharmD., BCPS Clinical Pharmacist Pager [phone]

## 2015-01-12 NOTE — ED Notes (Signed)
Pt tearful at time of VS taken. Mother at bedside.

## 2015-01-12 NOTE — ED Notes (Addendum)
C/o chills, generalized body aches, SOB nonprod cough, numbness to hands this am- sore throat x 2 weeks-NAD-last dose tylenol 1000mg  approx 30 min PTA-has also taken diet pill, alllergy med and restarted septra rx from 9/5

## 2015-01-12 NOTE — H&P (Signed)
[facility] History and Physical  Diana Mccormick YQI:[id number] DOB: October 31, 1985 DOA: 01/12/2015  Referring physician: PA Okey Regal - [facility] PCP: Nyoka Cowden, MD   Chief Complaint: body aches.   HPI: Diana Mccormick is a 29 y.o. female   Patient presented to [facility] today with fairly rapid onset of generalized body aches, fevers, and dysuria and frequency, and back pain. Symptoms started shortly after waking up this morning and are constant and progressive. Patient took a single Septra dose this morning which was left over from a previous prescription without any benefit. Patient states that she's also had some chest tightness, or wheezing. Nebulizer and albuterol inhaler with some improvement. Of note patient is a multiple recent infections including the following treated [facility] - UTI which started 4 weeks ago treated for 3 days of Septra with arms complete resolution, yeast vaginitis treated for with 1 pill of Diflucan, paronychia approximate 3-1/2 weeks ago treated with Septra again, flu approximately 2 weeks ago for patient was treated with Tamiflu with resolution. Patient still with intermittent white vaginal discharge and irritation. Also w/ sore thorat for 1 week. Pt also w/ h/o allergies  Review of Systems:  Constitutional:  No weight loss, night sweats HEENT:  No headaches, Difficulty swallowing,Tooth/dental problems,  ear ache, nasal congestion, post nasal drip,  Cardio-vascular:  No chest pain, Orthopnea, PND, swelling in lower extremities, anasarca, dizziness, palpitations  GI:  No heartburn, indigestion, abdominal pain, nausea, vomiting, diarrhea, change in bowel habits, loss of appetite  Resp:   No shortness of breath with exertion or at rest. No excess mucus, no productive cough, No non-productive cough, No coughing up of blood.No change in color of mucus.No wheezing.No chest wall deformity  Skin:  no rash or lesions.  GU: Pe  rHPI Musculoskeletal:   No joint pain or swelling. No decreased range of motion.  Psych:  No change in mood or affect. No depression or anxiety. No memory loss.   Past Medical History  Diagnosis Date  . Asthma   . Migraine    History reviewed. No pertinent past surgical history. Social History:  reports that she quit smoking about 11 months ago. She has never used smokeless tobacco. She reports that she does not drink alcohol or use illicit drugs.  No Known Allergies  Family History  Problem Relation Age of Onset  . Hypertension       Prior to Admission medications   Medication Sig Start Date End Date Taking? Authorizing Provider  albuterol (PROVENTIL HFA;VENTOLIN HFA) 108 (90 BASE) MCG/ACT inhaler Inhale 2 puffs into the lungs every 4 (four) hours as needed for wheezing or shortness of breath. 09/25/14  Yes Marletta Lor, MD  albuterol (PROVENTIL) (2.5 MG/3ML) 0.083% nebulizer solution Take 3 mLs (2.5 mg total) by nebulization every 4 (four) hours as needed for wheezing. 08/29/13  Yes Marletta Lor, MD  loratadine (CLARITIN) 10 MG tablet Take 10 mg by mouth daily.   Yes Historical Provider, MD  naproxen sodium (ANAPROX) 220 MG tablet Take 220 mg by mouth 2 (two) times daily as needed (for pain).   Yes Historical Provider, MD  Phendimetrazine Tartrate 35 MG TABS Take 70 mg by mouth 2 (two) times daily.   Yes Historical Provider, MD  ibuprofen (ADVIL,MOTRIN) 200 MG tablet Take 800 mg by mouth every 6 (six) hours as needed for moderate pain.    Historical Provider, MD  ipratropium-albuterol (DUONEB) 0.5-2.5 (3) MG/3ML SOLN Take 3 mLs by nebulization every 6 (  six) hours as needed. 12/23/12   Liam Graham, PA-C  PHENDIMETRAZINE TARTRATE CR PO Take 1 tablet by mouth 2 (two) times daily.    Historical Provider, MD  sulfamethoxazole-trimethoprim (BACTRIM DS,SEPTRA DS) 800-160 MG tablet Take 1 tablet by mouth once.    Historical Provider, MD  VENTOLIN HFA 108 (90 BASE) MCG/ACT  inhaler INHALE 2 PUFFS INTO THE LUNGS EVERY 6 HOURS AS NEEDED FOR WHEEZING. 09/25/14   Marletta Lor, MD   Physical Exam: Filed Vitals:   01/12/15 1605 01/12/15 1709 01/12/15 1834 01/12/15 2006  BP: 115/53 101/47 91/41   Pulse:  112 112   Temp:  99.2 F (37.3 C) 99.4 F (37.4 C)   TempSrc:  Oral Oral   Resp: 16 20 15    Height:   5\' 6"  (1.676 m)   Weight:   78.1 kg (172 lb 2.9 oz)   SpO2:  99% 100% 100%    Wt Readings from Last 3 Encounters:  01/12/15 78.1 kg (172 lb 2.9 oz)  10/23/14 75.751 kg (167 lb)  05/08/14 80.74 kg (178 lb)    General:  Appears calm and comfortable Eyes:  PERRL, normal lids, irises & conjunctiva ENT:  grossly normal hearing, lips & tongue Neck:  no LAD, masses or thyromegaly Cardiovascular:  RRR, no m/r/g. No LE edema. Telemetry:  SR, no arrhythmias  Respiratory:  CTA bilaterally, no w/r/r. Normal respiratory effort. Abdomen:  soft, ntnd Skin:  no rash or induration seen on limited exam Musculoskeletal:  grossly normal tone BUE/BLE Psychiatric:  grossly normal mood and affect, speech fluent and appropriate Neurologic:  grossly non-focal.          Labs on Admission:  Basic Metabolic Panel:  Recent Labs Lab 01/12/15 1240  NA 136  K 3.3*  CL 107  CO2 21*  GLUCOSE 87  BUN 14  CREATININE 0.79  CALCIUM 8.7*   Liver Function Tests:  Recent Labs Lab 01/12/15 1240  AST 32  ALT 20  ALKPHOS 38  BILITOT 0.3  PROT 7.0  ALBUMIN 3.8   No results for input(s): LIPASE, AMYLASE in the last 168 hours. No results for input(s): AMMONIA in the last 168 hours. CBC:  Recent Labs Lab 01/12/15 1240  WBC 5.4  NEUTROABS 5.1  HGB 11.9*  HCT 36.1  MCV 84.9  PLT 220   Cardiac Enzymes: No results for input(s): CKTOTAL, CKMB, CKMBINDEX, TROPONINI in the last 168 hours.  BNP (last 3 results) No results for input(s): BNP in the last 8760 hours.  ProBNP (last 3 results) No results for input(s): PROBNP in the last 8760 hours.  CBG: No  results for input(s): GLUCAP in the last 168 hours.  Radiological Exams on Admission: Dg Chest 2 View  01/12/2015   CLINICAL DATA:  Chest pain and tightness, fever, chest congestion, malaise.  EXAM: CHEST  2 VIEW  COMPARISON:  01/30/2014.  FINDINGS: The heart size and mediastinal contours are within normal limits. Both lungs are clear. The visualized skeletal structures are unremarkable.  IMPRESSION: No active cardiopulmonary disease.   Electronically Signed   By: Claudie Revering M.D.   On: 01/12/2015 13:43      Assessment/Plan Principal Problem:   Sepsis ([place]) Active Problems:   Asthma   Obesity   Insomnia   Sepsis: Suspect urologic etiology (pyelonephritis). Hypotensive (systolic baseline 226-333), febrile, lactic acid 2.11), WBC 5.4 but left shift present. Repeat lactic acid 0.88. CXR negative for acute process. Rapid strep neg - Stepdown (Admit from med center  [facility]) - Follow-up BCX, UCX - IVF - Continue ceftriaxone - f/u Strep Cx  Asthma/allergies: Lungs clear but pt complaining of intermittent wheezing and chest tightness - relieved w/ nebs. No O2 requirement or LE swelling. CXR clear.  - Duoneb Q4 - EKG - tropx1 - Zyrtec, mucinex  Yeast vaginitis: - diflucan 150mg  x1 then repeat in 3 days - wet prep / STD panel  Insomnia: - continue melatonin  Obesity: - hold home diet pill. Recommend pt DC   Code Status: FULL  DVT Prophylaxis: Lovenox Family Communication: Father Disposition Plan: [place] gimprovement     Garvin Ellena Lenna Sciara, MD Family Medicine [facility] [email or web address] Password [email or web address]

## 2015-01-12 NOTE — ED Notes (Signed)
Pt ambulated from triage room 2 to BR across the hall and back w/o distress

## 2015-01-12 NOTE — ED Notes (Signed)
In cleaning room pt t-shirt found in sheets. Called and talked with Elmyra Ricks to tell pt to have family member pick up when they wanted to.

## 2015-01-12 NOTE — Progress Notes (Signed)
[facility]  Called for direct admit [facility]. 29 year old female who was treated for the flu about 1-2 weeks ago with Tamiflu presenting with generalized body aches cough and sore throat. Found to be febrile tachycardic hypotensive with mild lactic acidosis. Is being rechecked for the flu. No other source of infection found. Will admit to a stepdown bed.   Debbe Odea, MD

## 2015-01-12 NOTE — ED Notes (Signed)
Audrey from [facility] called to ask about cancelling  Lactic acid. Talked with Merry Proud PA. Lactic acid ordered.

## 2015-01-12 NOTE — ED Notes (Signed)
Report given to Shannon RN with [facility] 

## 2015-01-12 NOTE — ED Provider Notes (Signed)
CSN: [id number]     Arrival date & time 01/12/15  1158 History   First MD Initiated Contact with Patient 01/12/15 1250     Chief Complaint  Patient presents with  . Generalized Body Aches   HPI   78 YOF presents today with generalized body aches, fever, chest tightness, and urinary complaints. Pt reports that she woke up this morning with the symptoms; she had a left over prescription of Septra that she took a dose of this morning for potential urinary tract infection. She states that the chest tightness is normal for her as she has a history of asthma; symptoms moderately relieved with use of at home nebulized treatment. She reports approx. two weeks ago she was diagnosed with the flu, resolved with use of Tamiflu; since that time she has had sore throat that has worsened over the last day. She denies difficulty swallowing, drooling, neck stiffness. Pt also reports having an infected finger that was successfully treated with oral antibiotics. Pt denies headache, neck stiffness, cough, abdominal pian, vaginal discharge, n/v/d.    Past Medical History  Diagnosis Date  . Asthma   . Migraine    History reviewed. No pertinent past surgical history. No family history on file. Social History  Substance Use Topics  . Smoking status: Former Smoker    Quit date: 02/05/2014  . Smokeless tobacco: Never Used  . Alcohol Use: No   OB History    No data available     Review of Systems  All other systems reviewed and are negative.   Allergies  Review of patient's allergies indicates no known allergies.  Home Medications   Prior to Admission medications   Medication Sig Start Date End Date Taking? Authorizing Provider  albuterol (PROVENTIL HFA;VENTOLIN HFA) 108 (90 BASE) MCG/ACT inhaler Inhale 2 puffs into the lungs every 4 (four) hours as needed for wheezing or shortness of breath. 09/25/14   Marletta Lor, MD  albuterol (PROVENTIL) (2.5 MG/3ML) 0.083% nebulizer solution Take 3 mLs (2.5  mg total) by nebulization every 4 (four) hours as needed for wheezing. 08/29/13   Marletta Lor, MD  ibuprofen (ADVIL,MOTRIN) 200 MG tablet Take 800 mg by mouth every 6 (six) hours as needed for moderate pain.    Historical Provider, MD  ipratropium-albuterol (DUONEB) 0.5-2.5 (3) MG/3ML SOLN Take 3 mLs by nebulization every 6 (six) hours as needed. 12/23/12   Freeman Caldron Baker, PA-C  loratadine (CLARITIN) 10 MG tablet Take 10 mg by mouth daily.    Historical Provider, MD  PHENDIMETRAZINE TARTRATE CR PO Take 1 tablet by mouth 2 (two) times daily.    Historical Provider, MD  VENTOLIN HFA 108 (90 BASE) MCG/ACT inhaler INHALE 2 PUFFS INTO THE LUNGS EVERY 6 HOURS AS NEEDED FOR WHEEZING. 09/25/14   Marletta Lor, MD   BP 115/53 mmHg  Pulse 115  Temp(Src) 99.7 F (37.6 C) (Oral)  Resp 16  Ht 5\' 6"  (1.676 m)  Wt 165 lb (74.844 kg)  BMI 26.64 kg/m2  SpO2 100%  LMP 12/13/2014   Physical Exam  Constitutional: She is oriented to person, place, and time. She appears well-developed and well-nourished.  Diaphoretic   HENT:  Head: Normocephalic and atraumatic.  Eyes: Conjunctivae and EOM are normal. Pupils are equal, round, and reactive to light. Right eye exhibits no discharge. Left eye exhibits no discharge. No scleral icterus.  Neck: Normal range of motion. Neck supple. No JVD present. No tracheal deviation present.  Cardiovascular: Regular rhythm, normal heart  sounds and intact distal pulses.  Exam reveals no gallop and no friction rub.   No murmur heard. Pulmonary/Chest: Effort normal and breath sounds normal. No stridor. No respiratory distress. She has no wheezes. She has no rales. She exhibits no tenderness.  Neurological: She is alert and oriented to person, place, and time. Coordination normal.  Skin: She is diaphoretic.  Psychiatric: She has a normal mood and affect. Her behavior is normal. Judgment and thought content normal.  Nursing note and vitals reviewed.     ED Course   Procedures (including critical care time) Labs Review Labs Reviewed  URINALYSIS, ROUTINE W REFLEX MICROSCOPIC (NOT AT [facility]) - Abnormal; Notable for the following:    APPearance CLOUDY (*)    Leukocytes, UA SMALL (*)    All other components within normal limits  URINE MICROSCOPIC-ADD ON - Abnormal; Notable for the following:    Squamous Epithelial / LPF FEW (*)    Bacteria, UA FEW (*)    All other components within normal limits  COMPREHENSIVE METABOLIC PANEL - Abnormal; Notable for the following:    Potassium 3.3 (*)    CO2 21 (*)    Calcium 8.7 (*)    All other components within normal limits  CBC WITH DIFFERENTIAL/PLATELET - Abnormal; Notable for the following:    Hemoglobin 11.9 (*)    Lymphs Abs 0.3 (*)    Monocytes Absolute 0.0 (*)    All other components within normal limits  I-STAT CG4 LACTIC ACID, ED - Abnormal; Notable for the following:    Lactic Acid, Venous 2.11 (*)    All other components within normal limits  RAPID STREP SCREEN (NOT AT [facility])  CULTURE, GROUP A STREP  CULTURE, BLOOD (ROUTINE X 2)  CULTURE, BLOOD (ROUTINE X 2)  URINE CULTURE  PREGNANCY, URINE  INFLUENZA PANEL BY PCR (TYPE A & B, H1N1)(NOT AT [facility])  I-STAT CG4 LACTIC ACID, ED    Imaging Review Dg Chest 2 View  01/12/2015   CLINICAL DATA:  Chest pain and tightness, fever, chest congestion, malaise.  EXAM: CHEST  2 VIEW  COMPARISON:  01/30/2014.  FINDINGS: The heart size and mediastinal contours are within normal limits. Both lungs are clear. The visualized skeletal structures are unremarkable.  IMPRESSION: No active cardiopulmonary disease.   Electronically Signed   By: Claudie Revering M.D.   On: 01/12/2015 13:43   I have personally reviewed and evaluated these images and lab results as part of my medical decision-making.   EKG Interpretation   Date/Time:  Monday January 12 2015 13:19:46 EDT Ventricular Rate:  117 PR Interval:  128 QRS Duration: 70 QT Interval:  308 QTC Calculation: 429 R  Axis:   79 Text Interpretation:  Sinus tachycardia Nonspecific T wave abnormality No  previous tracing Confirmed by Ashok Cordia  MD, Lennette Bihari ([id number]) on 01/12/2015  1:28:21 PM      MDM   Final diagnoses:  Sepsis, due to unspecified organism [facility])    Labs: Influenza, i-STAT lactic acid, blood culture, CMP, CBC, rapid strep, urinalysis, pregnancy, microscopic- Lactic acid 2.11  Imaging: DG chest- no active cardiopulmonary disease  Consults: Hospitalist Dr. Wynelle Cleveland   Therapeutics: Potassium, DuoNeb, ceftriaxone, ibuprofen, normal saline  Discharge Meds:   Assessment/Plan: Patient presents with generalized body aches, fatigue, fever. Patient meets sepsis criteria with fever, tachycardia, tachypnea, and hypotension. Patient's blood pressures have remained in the low 90s, most recent in the upper 80s. Patient has an elevated lactic acid at 2.11, remainder of her labs relatively noncontributory.  Uncertain source of infection at this time, this could likely represent influenza. Due to patient's concerning vital signs hospital service will be consulted for observation; stepdown at [facility] for further evaluation and management under the care of Dr. Wynelle Cleveland. Patient appeared stable at the time of transfer.          Okey Regal, PA-C 01/12/15 [place], MD 01/13/15 [phone]

## 2015-01-12 NOTE — ED Notes (Signed)
[facility] here to transfer pt to [facility] for further tx.

## 2015-01-13 ENCOUNTER — Observation Stay (HOSPITAL_COMMUNITY): Payer: 59

## 2015-01-13 ENCOUNTER — Ambulatory Visit: Payer: 59 | Admitting: Internal Medicine

## 2015-01-13 DIAGNOSIS — I959 Hypotension, unspecified: Secondary | ICD-10-CM | POA: Diagnosis present

## 2015-01-13 DIAGNOSIS — N7093 Salpingitis and oophoritis, unspecified: Secondary | ICD-10-CM

## 2015-01-13 DIAGNOSIS — I9589 Other hypotension: Secondary | ICD-10-CM

## 2015-01-13 DIAGNOSIS — Z975 Presence of (intrauterine) contraceptive device: Secondary | ICD-10-CM | POA: Diagnosis present

## 2015-01-13 DIAGNOSIS — E8809 Other disorders of plasma-protein metabolism, not elsewhere classified: Secondary | ICD-10-CM | POA: Diagnosis present

## 2015-01-13 DIAGNOSIS — R509 Fever, unspecified: Secondary | ICD-10-CM | POA: Diagnosis present

## 2015-01-13 LAB — BASIC METABOLIC PANEL WITH GFR
Anion gap: 6 (ref 5–15)
BUN: 6 mg/dL (ref 6–20)
CO2: 16 mmol/L — ABNORMAL LOW (ref 22–32)
Calcium: 6.6 mg/dL — ABNORMAL LOW (ref 8.9–10.3)
Chloride: 117 mmol/L — ABNORMAL HIGH (ref 101–111)
Creatinine, Ser: 0.75 mg/dL (ref 0.44–1.00)
GFR calc Af Amer: >60 mL/min
GFR calc non Af Amer: >60 mL/min
Glucose, Bld: 120 mg/dL — ABNORMAL HIGH (ref 65–99)
Potassium: 3.6 mmol/L (ref 3.5–5.1)
Sodium: 139 mmol/L (ref 135–145)

## 2015-01-13 LAB — CBC
HCT: 31.3 % — ABNORMAL LOW (ref 36.0–46.0)
Hemoglobin: 10.1 g/dL — ABNORMAL LOW (ref 12.0–15.0)
MCH: 27.7 pg (ref 26.0–34.0)
MCHC: 32.3 g/dL (ref 30.0–36.0)
MCV: 86 fL (ref 78.0–100.0)
PLATELETS: 182 10*3/uL (ref 150–400)
RBC: 3.64 MIL/uL — ABNORMAL LOW (ref 3.87–5.11)
RDW: 13.4 % (ref 11.5–15.5)
WBC: 10.7 10*3/uL — ABNORMAL HIGH (ref 4.0–10.5)

## 2015-01-13 LAB — URINE CULTURE: Culture: 8000

## 2015-01-13 LAB — WET PREP, GENITAL
Trich, Wet Prep: NONE SEEN
Yeast Wet Prep HPF POC: NONE SEEN

## 2015-01-13 LAB — HEPATIC FUNCTION PANEL
ALT: 14 U/L (ref 14–54)
AST: 25 U/L (ref 15–41)
Albumin: 2.4 g/dL — ABNORMAL LOW (ref 3.5–5.0)
Alkaline Phosphatase: 28 U/L — ABNORMAL LOW (ref 38–126)
Bilirubin, Direct: 0.2 mg/dL (ref 0.1–0.5)
Indirect Bilirubin: 0.2 mg/dL — ABNORMAL LOW (ref 0.3–0.9)
Total Bilirubin: 0.4 mg/dL (ref 0.3–1.2)
Total Protein: 4.7 g/dL — ABNORMAL LOW (ref 6.5–8.1)

## 2015-01-13 LAB — CERVICOVAGINAL ANCILLARY ONLY
Chlamydia: NEGATIVE
Neisseria Gonorrhea: NEGATIVE

## 2015-01-13 LAB — HIV ANTIBODY (ROUTINE TESTING W REFLEX): HIV Screen 4th Generation wRfx: NONREACTIVE

## 2015-01-13 LAB — GC/CHLAMYDIA PROBE AMP (~~LOC~~) NOT AT ARMC
Chlamydia: NEGATIVE
Neisseria Gonorrhea: NEGATIVE

## 2015-01-13 LAB — MAGNESIUM: Magnesium: 1.1 mg/dL — ABNORMAL LOW (ref 1.7–2.4)

## 2015-01-13 LAB — ALBUMIN: Albumin: 2.8 g/dL — ABNORMAL LOW (ref 3.5–5.0)

## 2015-01-13 MED ORDER — MAGNESIUM SULFATE 4 GM/100ML IV SOLN
4.0000 g | Freq: Once | INTRAVENOUS | Status: AC
  Administered 2015-01-13: 4 g via INTRAVENOUS
  Filled 2015-01-13: qty 100

## 2015-01-13 MED ORDER — DIPHENHYDRAMINE HCL 50 MG/ML IJ SOLN
25.0000 mg | Freq: Once | INTRAMUSCULAR | Status: AC
  Administered 2015-01-13: 25 mg via INTRAVENOUS
  Filled 2015-01-13: qty 1

## 2015-01-13 MED ORDER — DOPAMINE-DEXTROSE 3.2-5 MG/ML-% IV SOLN
5.0000 ug/kg/min | INTRAVENOUS | Status: DC
  Administered 2015-01-13: 5 ug/kg/min via INTRAVENOUS
  Filled 2015-01-13: qty 250

## 2015-01-13 MED ORDER — KETOROLAC TROMETHAMINE 30 MG/ML IJ SOLN
30.0000 mg | Freq: Once | INTRAMUSCULAR | Status: AC
  Administered 2015-01-13: 30 mg via INTRAVENOUS
  Filled 2015-01-13: qty 1

## 2015-01-13 MED ORDER — SODIUM CHLORIDE 0.9 % IV SOLN
2.0000 g | Freq: Once | INTRAVENOUS | Status: AC
  Administered 2015-01-13: 2 g via INTRAVENOUS
  Filled 2015-01-13: qty 20

## 2015-01-13 MED ORDER — IPRATROPIUM-ALBUTEROL 0.5-2.5 (3) MG/3ML IN SOLN
3.0000 mL | RESPIRATORY_TRACT | Status: DC | PRN
  Administered 2015-01-14: 3 mL via RESPIRATORY_TRACT
  Filled 2015-01-13: qty 3

## 2015-01-13 MED ORDER — METOCLOPRAMIDE HCL 5 MG/ML IJ SOLN
10.0000 mg | Freq: Once | INTRAMUSCULAR | Status: AC
  Administered 2015-01-13: 10 mg via INTRAVENOUS
  Filled 2015-01-13: qty 2

## 2015-01-13 MED ORDER — DOPAMINE-DEXTROSE 3.2-5 MG/ML-% IV SOLN: 0.0000 ug/kg/min | INTRAVENOUS | Status: DC

## 2015-01-13 MED ORDER — SODIUM CHLORIDE 0.9 % IV BOLUS (SEPSIS)
1000.0000 mL | Freq: Once | INTRAVENOUS | Status: AC
  Administered 2015-01-13: 1000 mL via INTRAVENOUS

## 2015-01-13 MED ORDER — ALBUMIN HUMAN 25 % IV SOLN
50.0000 g | Freq: Once | INTRAVENOUS | Status: AC
  Administered 2015-01-13: 50 g via INTRAVENOUS
  Filled 2015-01-13: qty 200

## 2015-01-13 NOTE — Progress Notes (Addendum)
Shift event note:  Notified by RN that pt requests that her IVF's be cut off. States she is getting too much and is concerned about new swelling to her (R) arm and face. Request provider come evaluate. Pt has rec'd multiple fluid boluses (6L) tonight for persistent hypotension. BP currently 92/58 w/ MAP of 69. At bedside pt noted sitting up in bed in NAD. She is quite pleasant and smiles at intervals. Pt has had good UOP and is ambulatory to BR w/o difficulty. Discussed her facial swelling. There is some mild generalized facial swelling w/o rash, itching or erythema. Pt also points out swelling to (R) arm at IV site. There is also some mild generalized swelling to RUE, at IV site,  w/o rash, itching or erythema. BBS CTA. Pt also wanted to report that she has an IUD and is asking if she will get a pelvic exam and or ultrasound to ensure proper placement and to r/o infection. 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 Endorses intermittent white/brown vag d/c w/ h/o BV. Reports h/a and requesting med for same. Admits to h/o migraines. Pt requesting diet.  Assessment/Plan: 1. Persistent hypotension: In setting of sepsis w/o identified source. MAP's currently 65-70. Monitor for now w/ low threshold to start pressors if hypotension worsens.  2. H/A: h/o migraines. Will medicate w/ Reglan, Benadryl and Toradol IV x 1. 3. Facial/RUE swelling: Etiology unclear. Doubt allergic reaction. Elevate RUE. Will monitor for now.   4. IUD concerns: Pelvic u/s this am.  Jeryl Columbia, NP-C [facility] Pager [phone]   0700:  Notified by RN BP's now 74/33-80/41 (Manual). Discussed pt w/ Dr Sherral Hammers who is in agreement w/ pelvic u/s. Will start Dopamine at 38mcq/kg/min and add mag and CMP to am labs.

## 2015-01-13 NOTE — Progress Notes (Signed)
Utilization Review Completed.Donne Anon T10/07/2014

## 2015-01-13 NOTE — Progress Notes (Signed)
NP, Schorr made aware of pts BP after bolus.

## 2015-01-13 NOTE — Progress Notes (Signed)
Spoke with NP about pts BP's Map is currently 45. Will get manual, will monitor.

## 2015-01-13 NOTE — Progress Notes (Signed)
Pt having facial swelling and complaining of having too much fluid, pt requesting for NP on call to come and see pt. Map on BP is now 58. 1L bolus stopped with 639ml left to give. NP made aware.

## 2015-01-13 NOTE — Progress Notes (Signed)
Manual BP's reported to NP 78/48 in R arm and 82/50 in L arm.

## 2015-01-13 NOTE — Progress Notes (Signed)
NP made aware of pts hypotension after bolus, pt also complaining of tingling in legs and lips. Made NP Schorr aware. No other Neuro symptoms at this time.

## 2015-01-13 NOTE — Progress Notes (Signed)
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 Symptoms started shortly after waking up this morning and are constant and progressive. Patient took a single Septra dose this morning which was left over from a previous prescription without any benefit. Patient states that she's also had some chest tightness, or wheezing. Nebulizer and albuterol inhaler with some improvement. Of note patient is a multiple recent infections including the following [facility] - UTI which started 4 weeks ago treated for 3 days of Septra with arms complete resolution, yeast vaginitis treated for with 1 pill of Diflucan, paronychia approximate 3-1/2 weeks ago treated with Septra again, flu approximately 2 weeks ago for patient was treated with Tamiflu with resolution. Patient still with intermittent white vaginal discharge and irritation. Also w/ sore thorat for 1 week. Pt also w/ h/o allergies   HPI/Subjective: 10/4 MAXIMUM TEMPERATURE 38.9, A/O 4, mild abdominal pain. States feels significantly improved.   Assessment/Plan: Sepsis unspecified organism:  -Patient with possible right ovarian abscess by ultrasound  -Pyelonephritis? Continue ceftriaxone.  -UA/blood culture pending -Consulted [facility] patient's regular OB/GYN is Dr. [name]   Right ovarian abscess vs cyst [facility] consulted, will await recommendation -Continue current antibiotics  Hypotension  -Secondary sepsis, continue normal saline at 100 ml/hr -Patient also hypomagnesemia, hypocalcemia, hypoalbuminemia will correct  -Continue dopamine drip will try to titrate off later today  -  Asthma/allergies:    -Lungs clear but pt complaining of intermittent wheezing and chest tightness  - CXR clear.  - Continue Duoneb Q4 - Zyrtec, mucinex  Yeast vaginitis: - diflucan 150mg  x1 then repeat in 3 days - wet prep; few clue cells, HIV negative, RPR pending  Insomnia: - continue melatonin  Obesity: - hold home diet pill. Recommend pt DC  Hypomagnesemia -Magnesium IV 4 gm  Hypoalbuminemia -Albumin 50 gm  Hypocalcemia -Corrected calcium= 8.0 -Calcium gluconate 2 gm      Code Status: FULL Family Communication: Mother present at time of exam Disposition Plan: Resolution sepsis    Consultants: OB/GYN pending   Procedure/Significant Events: 10/4 ultrasound pelvis/ultrasound transvaginal;1. 1.7 cm complex cyst right ovary. Given patient's history a small tubo-ovarian abscess cannot be excluded. Small Hemorrhagic cyst?. Associated small amount of free pelvic fluid noted. Short-interval follow up ultrasound in 6-12 weeks is recommended, preferably during the week following the patient's normal menses. - IUD noted in good anatomic position. - 2 cm uterine fibroid.     Culture 10/3 influenza A/B/H1N1 negative 10/3 HIV 1/2 negative 10/3 urine pending 10/3 group A strep pending 10/3 blood left AC/right hand NGTD 10/3 MRSA by PCR negative 10/4 wet prep positive few clue cells   Antibiotics: Ceftriaxone 10/3>>  DVT prophylaxis: Lovenox   Devices    LINES / TUBES:      Continuous Infusions: . sodium chloride 100 mL/hr at 01/13/15 0920  . DOPamine 5 mcg/kg/min (01/13/15 0747)    Objective: VITAL SIGNS: Temp: 98.6 F (37 C) (10/04 1137) Temp Source: Oral (10/04 1137) BP: 97/46 mmHg (10/04 1137) Pulse Rate: 100 (10/04 1137) SPO2; FIO2:   Intake/Output Summary (Last 24 hours) at 01/13/15 1425 Last data filed at 01/13/15 1345  Gross per 24 hour  Intake 624.58 ml  Output  1350 ml  Net -725.42 ml     Exam: General: A/O 4, mild abdominal pain, No  acute respiratory distress Eyes: Negative headache, eye pain, double vision,negative scleral hemorrhage ENT: Negative Runny nose, negative ear pain, negative gingival bleeding, Neck:  Negative scars, masses, torticollis, lymphadenopathy, JVD Lungs: Clear to auscultation bilaterally without wheezes or crackles Cardiovascular: Tachycardic, Regular rhythm without murmur gallop or rub normal S1 and S2 Abdomen: Positive mild Abdominal pain to palpation RLQ>>LLQ, nondistended, positive soft, bowel sounds, no rebound, no ascites, no appreciable mass Extremities: No significant cyanosis, clubbing, or edema bilateral lower extremities Psychiatric:  Negative depression, negative anxiety, negative fatigue, negative mania Neurologic:  Cranial nerves II through XII intact, tongue/uvula midline, all extremities muscle strength 5/5, sensation intact throughout, negative expressive aphasia, negative receptive aphasia.   Data Reviewed: Basic Metabolic Panel:  Recent Labs Lab 01/12/15 1240 01/12/15 2100 01/13/15 0334  NA 136  --  139  K 3.3*  --  3.6  CL 107  --  117*  CO2 21*  --  16*  GLUCOSE 87  --  120*  BUN 14  --  6  CREATININE 0.79 1.00 0.75  CALCIUM 8.7*  --  6.6*  MG  --   --  1.1*   Liver Function Tests:  Recent Labs Lab 01/12/15 1240 01/13/15 0334 01/13/15 1019  AST 32 25  --   ALT 20 14  --   ALKPHOS 38 28*  --   BILITOT 0.3 0.4  --   PROT 7.0 4.7*  --   ALBUMIN 3.8 2.4* 2.8*   No results for input(s): LIPASE, AMYLASE in the last 168 hours. No results for input(s): AMMONIA in the last 168 hours. CBC:  Recent Labs Lab 01/12/15 1240 01/12/15 2100 01/13/15 0334  WBC 5.4 10.9* 10.7*  NEUTROABS 5.1  --   --   HGB 11.9* 11.6* 10.1*  HCT 36.1 35.6* 31.3*  MCV 84.9 85.2 86.0  PLT 220 201 182   Cardiac Enzymes:  Recent Labs Lab 01/12/15 2100  TROPONINI <0.03   BNP (last 3 results) No results for input(s): BNP in the last 8760 hours.  ProBNP (last 3 results) No  results for input(s): PROBNP in the last 8760 hours.  CBG: No results for input(s): GLUCAP in the last 168 hours.  Recent Results (from the past 240 hour(s))  Urine culture     Status: None (Preliminary result)   Collection Time: 01/12/15 12:00 PM  Result Value Ref Range Status   Specimen Description URINE, RANDOM  Final   Special Requests NONE  Final   Culture   Final    NO GROWTH < 12 HOURS Performed at [facility]    Report Status PENDING  Incomplete  Rapid strep screen     Status: None   Collection Time: 01/12/15 12:25 PM  Result Value Ref Range Status   Streptococcus, Group A Screen (Direct) NEGATIVE NEGATIVE Final    Comment: (NOTE) A Rapid Antigen test may result negative if the antigen level in the sample is below the detection level of this test. The FDA has not cleared this test as a stand-alone test therefore the rapid antigen negative result has reflexed to a Group A Strep culture.   Blood Culture (routine x 2)     Status: None (Preliminary result)   Collection Time: 01/12/15 12:40 PM  Result Value Ref Range Status   Specimen Description BLOOD LEFT ANTECUBITAL  Final   Special Requests BOTTLES DRAWN AEROBIC AND ANAEROBIC  [employer] EACH  Final   Culture   Final    NO GROWTH < 24 HOURS Performed at [facility]    Report Status PENDING  Incomplete  Blood Culture (routine x 2)     Status: None (Preliminary result)   Collection Time: 01/12/15 12:50 PM  Result Value Ref Range Status   Specimen Description BLOOD RIGHT HAND  Final   Special Requests BOTTLES DRAWN AEROBIC AND ANAEROBIC 5CC EACH  Final   Culture   Final    NO GROWTH < 24 HOURS Performed at [facility]    Report Status PENDING  Incomplete  MRSA PCR Screening     Status: None   Collection Time: 01/12/15  7:26 PM  Result Value Ref Range Status   MRSA by PCR NEGATIVE NEGATIVE Final    Comment:        The GeneXpert MRSA Assay (FDA approved for NASAL specimens only), is one component  of a comprehensive MRSA colonization surveillance program. It is not intended to diagnose MRSA infection nor to guide or monitor treatment for MRSA infections.   Wet prep, genital     Status: Abnormal   Collection Time: 01/13/15  3:56 AM  Result Value Ref Range Status   Yeast Wet Prep HPF POC NONE SEEN NONE SEEN Final   Trich, Wet Prep NONE SEEN NONE SEEN Final   Clue Cells Wet Prep HPF POC FEW (A) NONE SEEN Final   WBC, Wet Prep HPF POC FEW (A) NONE SEEN Final     Studies:  Recent x-ray studies have been reviewed in detail by the Attending Physician  Scheduled Meds:  Scheduled Meds: . calcium gluconate  2 g Intravenous Once  . cefTRIAXone (ROCEPHIN)  IV  1 g Intravenous Q24H  . enoxaparin (LOVENOX) injection  40 mg Subcutaneous Q24H  . fluconazole  150 mg Oral Once per day on Mon Thu  . ipratropium-albuterol  3 mL Nebulization Once  . loratadine  10 mg Oral Daily  . magnesium sulfate 1 - 4 g bolus IVPB  4 g Intravenous Once  . Melatonin  3 mg Oral QHS  . sodium chloride  3 mL Intravenous Q12H    Time spent on care of this patient: 40 mins   Mccormick, Diana Docker , MD  [facility] Office  [phone] Pager [phone]  On-Call/Text Page:      [email or web address]      password [email or web address]  If 7PM-7AM, please contact night-coverage [email or web address] Password [email or web address] 01/13/2015, 2:25 PM   LOS: 1 day   Care during the described time interval was provided by me .  I have reviewed this patient's available data, including medical history, events of note, physical examination, and all test results as part of my evaluation. I have personally reviewed and interpreted all radiology studies.   Diana Crawford, MD [phone] Pager

## 2015-01-14 DIAGNOSIS — G4700 Insomnia, unspecified: Secondary | ICD-10-CM

## 2015-01-14 DIAGNOSIS — J4530 Mild persistent asthma, uncomplicated: Secondary | ICD-10-CM

## 2015-01-14 DIAGNOSIS — Z975 Presence of (intrauterine) contraceptive device: Secondary | ICD-10-CM

## 2015-01-14 DIAGNOSIS — N760 Acute vaginitis: Secondary | ICD-10-CM

## 2015-01-14 DIAGNOSIS — R509 Fever, unspecified: Secondary | ICD-10-CM

## 2015-01-14 DIAGNOSIS — I959 Hypotension, unspecified: Secondary | ICD-10-CM

## 2015-01-14 DIAGNOSIS — E8809 Other disorders of plasma-protein metabolism, not elsewhere classified: Secondary | ICD-10-CM

## 2015-01-14 LAB — RPR: RPR: NONREACTIVE

## 2015-01-14 LAB — CULTURE, GROUP A STREP: STREP A CULTURE: NEGATIVE

## 2015-01-14 MED ORDER — CEFUROXIME AXETIL 500 MG PO TABS: 500.0000 mg | ORAL_TABLET | Freq: Two times a day (BID) | ORAL | Status: DC

## 2015-01-14 MED ORDER — CEFUROXIME AXETIL 500 MG PO TABS
500.0000 mg | ORAL_TABLET | Freq: Two times a day (BID) | ORAL | Status: DC
  Administered 2015-01-14: 500 mg via ORAL
  Filled 2015-01-14: qty 1

## 2015-01-14 MED ORDER — ALBUTEROL SULFATE (2.5 MG/3ML) 0.083% IN NEBU
2.5000 mg | INHALATION_SOLUTION | RESPIRATORY_TRACT | Status: DC | PRN
  Administered 2015-01-14: 2.5 mg via RESPIRATORY_TRACT
  Filled 2015-01-14: qty 3

## 2015-01-14 MED ORDER — FLUCONAZOLE 150 MG PO TABS: 150.0000 mg | ORAL_TABLET | Freq: Once | ORAL | Status: DC

## 2015-01-14 NOTE — Discharge Instructions (Signed)

## 2015-01-14 NOTE — Discharge Summary (Signed)
DISCHARGE SUMMARY  Diana Mccormick  MR#: [id number]  DOB:1985-07-07  Date of Admission: 01/12/2015 Date of Discharge: 01/14/2015  Attending Physician:MCCLUNG,JEFFREY T  Patient's LNL:[id number],CXKGY Pilar Plate, MD  Consults: GYN  Disposition: D/C home w/ mother   Follow-up Appts:     Follow-up Information    Follow up with Diana Cowden, MD In 1 week.   Specialty:  Internal Medicine   Contact information:   [place] [phone]       Follow up with Diana Bruins, MD.   Specialty:  Obstetrics and Gynecology   Why:  Keep your previously scheduled follow up appointment.     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 Symptoms started shortly after waking up and were constant and progressive. Patient stated she also had some chest tightness / wheezing. Of note patient had been tx for multiple recent infections via an UC center including a UTI 4 weeks prior which was treated for 3 days w/ Septra.  Other tx courses included:  A yeast vaginitis treated with 1 pill of Diflucan, paronychia approximate 3-1/2 weeks prior treated with Septra, flu 2 weeks prior treated with Tamiflu.  Hospital Course:  Early Sepsis unspecified organism - suspected Pyelonephritis  -HR 115 - RR 20- lactic acidosis  -no evidence of PID or TOA per GYN - US findings felt to be hemorrhagic ovarian cyst  -Pyelonephritis? - tx empirically w/ rocephin during admit - transitioned to ceftin at d/c  -UA modestly abnorm - urine  culture not helpful / not conclusive  -Consulted [facility]   Right ovarian abscess vs cyst -GYN suspects small R hemorrhagic ovarian cyst   Hypotension  -Secondary volume depletion / early sepsis - resolved w/ volume expansion - required short course dopamine for support   Asthma - CXR clear - no wheezing at time of d/c - acute exacerbation quickly resolved   Yeast vaginitis - diflucan 150mg  x1 then repeat in 3 days  Bacterial vaginosis -has been covered by abx course utilized in hospital  -HIV negative, RPR negative - GC and Chlam negative   Insomnia -continue melatonin  Hypomagnesemia -replaced w/ 4g IV Mg during admit   Hypoalbuminemia -Albumin 50g per Dr. Sherral Hammers   Hypocalcemia -Corrected calcium 8.0 -Calcium gluconate 2 gm given per Dr. Sherral Hammers      Medication List    STOP taking these medications        ipratropium-albuterol 0.5-2.5 (3) MG/3ML Soln  Commonly known as:  DUONEB     oseltamivir 75 MG capsule  Commonly known as:  TAMIFLU     Phendimetrazine Tartrate 35 MG Tabs     sulfamethoxazole-trimethoprim 800-160 MG tablet  Commonly known as:  BACTRIM DS,SEPTRA DS      TAKE these medications        acetaminophen 500 MG tablet  Commonly known as:  TYLENOL  Take 1,000 mg by mouth every 6 (six) hours as needed for moderate pain.     albuterol (2.5 MG/3ML) 0.083% nebulizer solution  Commonly known as:  PROVENTIL  Take 3 mLs (2.5 mg total) by nebulization every 4 (four) hours as needed  for wheezing.     albuterol 108 (90 BASE) MCG/ACT inhaler  Commonly known as:  PROVENTIL HFA;VENTOLIN HFA  Inhale 2 puffs into the lungs every 4 (four) hours as needed for wheezing or shortness of breath.     cefUROXime 500 MG tablet  Commonly known as:  CEFTIN  Take 1 tablet (500 mg total) by mouth 2 (two) times daily with a meal.     fluconazole 150 MG tablet  Commonly known as:  DIFLUCAN  Take 1 tablet (150 mg total) by mouth once.  Start taking on:   01/15/2015     loratadine 10 MG tablet  Commonly known as:  CLARITIN  Take 10 mg by mouth daily.     Melatonin 5 MG Tabs  Take 5 mg by mouth at bedtime.     naproxen sodium 220 MG tablet  Commonly known as:  ANAPROX  Take 220 mg by mouth 2 (two) times daily as needed (for pain).     phenol 1.4 % Liqd  Commonly known as:  CHLORASEPTIC  Use as directed 1 spray in the mouth or throat as needed for throat irritation / pain.        Day of Discharge BP 118/80 mmHg  Pulse 77  Temp(Src) 97.8 F (36.6 C) (Oral)  Resp 16  Ht 5\' 6"  (1.676 m)  Wt 78.1 kg (172 lb 2.9 oz)  BMI 27.80 kg/m2  SpO2 100%  LMP 12/13/2014  Physical Exam: General: No acute respiratory distress Lungs: Clear to auscultation bilaterally without wheezes or crackles Cardiovascular: Regular rate and rhythm without murmur gallop or rub normal S1 and S2 Abdomen: Nontender, nondistended, soft, bowel sounds positive, no rebound, no ascites, no appreciable mass Extremities: No significant cyanosis, clubbing, or edema bilateral lower extremities  Basic Metabolic Panel:  Recent Labs Lab 01/12/15 1240 01/12/15 2100 01/13/15 0334  NA 136  --  139  K 3.3*  --  3.6  CL 107  --  117*  CO2 21*  --  16*  GLUCOSE 87  --  120*  BUN 14  --  6  CREATININE 0.79 1.00 0.75  CALCIUM 8.7*  --  6.6*  MG  --   --  1.1*    Liver Function Tests:  Recent Labs Lab 01/12/15 1240 01/13/15 0334 01/13/15 1019  AST 32 25  --   ALT 20 14  --   ALKPHOS 38 28*  --   BILITOT 0.3 0.4  --   PROT 7.0 4.7*  --   ALBUMIN 3.8 2.4* 2.8*   CBC:  Recent Labs Lab 01/12/15 1240 01/12/15 2100 01/13/15 0334  WBC 5.4 10.9* 10.7*  NEUTROABS 5.1  --   --   HGB 11.9* 11.6* 10.1*  HCT 36.1 35.6* 31.3*  MCV 84.9 85.2 86.0  PLT 220 201 182   Cardiac Enzymes:  Recent Labs Lab 01/12/15 2100  TROPONINI <0.03    Recent Results (from the past 240 hour(s))  Urine culture     Status: None   Collection Time: 01/12/15 12:00 PM    Result Value Ref Range Status   Specimen Description URINE, RANDOM  Final   Special Requests NONE  Final   Culture   Final    8,000 COLONIES/mL INSIGNIFICANT GROWTH Performed at [facility]    Report Status 01/13/2015 FINAL  Final  Rapid strep screen     Status: None   Collection Time: 01/12/15 12:25 PM  Result Value Ref Range Status   Streptococcus, Group A  Screen (Direct) NEGATIVE NEGATIVE Final    Comment: (NOTE) A Rapid Antigen test may result negative if the antigen level in the sample is below the detection level of this test. The FDA has not cleared this test as a stand-alone test therefore the rapid antigen negative result has reflexed to a Group A Strep culture.   Culture, Group A Strep     Status: None   Collection Time: 01/12/15 12:25 PM  Result Value Ref Range Status   Strep A Culture Negative  Final    Comment: (NOTE) Performed At: [facility] [place], [place] [id number] Lindon Romp MD MI:[id number]   Blood Culture (routine x 2)     Status: None (Preliminary result)   Collection Time: 01/12/15 12:40 PM  Result Value Ref Range Status   Specimen Description BLOOD LEFT ANTECUBITAL  Final   Special Requests BOTTLES DRAWN AEROBIC AND ANAEROBIC 5CC EACH  Final   Culture   Final    NO GROWTH 2 DAYS Performed at [facility]    Report Status PENDING  Incomplete  Blood Culture (routine x 2)     Status: None (Preliminary result)   Collection Time: 01/12/15 12:50 PM  Result Value Ref Range Status   Specimen Description BLOOD RIGHT HAND  Final   Special Requests BOTTLES DRAWN AEROBIC AND ANAEROBIC 5CC EACH  Final   Culture   Final    NO GROWTH 2 DAYS Performed at [facility]    Report Status PENDING  Incomplete  MRSA PCR Screening     Status: None   Collection Time: 01/12/15  7:26 PM  Result Value Ref Range Status   MRSA by PCR NEGATIVE NEGATIVE Final    Comment:        The GeneXpert MRSA Assay (FDA approved  for NASAL specimens only), is one component of a comprehensive MRSA colonization surveillance program. It is not intended to diagnose MRSA infection nor to guide or monitor treatment for MRSA infections.   Wet prep, genital     Status: Abnormal   Collection Time: 01/13/15  3:56 AM  Result Value Ref Range Status   Yeast Wet Prep HPF POC NONE SEEN NONE SEEN Final   Trich, Wet Prep NONE SEEN NONE SEEN Final   Clue Cells Wet Prep HPF POC FEW (A) NONE SEEN Final   WBC, Wet Prep HPF POC FEW (A) NONE SEEN Final      Time spent in discharge (includes decision making & examination of pt): >35 minutes  01/14/2015, 4:25 PM   Cherene Altes, MD [facility] Office  [phone] Pager [phone]  On-Call/Text Page:      [email or web address]      password [facility]

## 2015-01-14 NOTE — Progress Notes (Signed)
   01/14/2015  To Whom it may concern,  Diana Mccormick was admitted to [facility] on 01/12/2015 and remained under my care in the hospital through 01/14/2015.  She has been advised that she should not return to work until 01/19/2015, at which time she will be cleared to resume all of her usual responsibilities.  Sincerely,  Cherene Altes, MD [facility] Office  [phone]

## 2015-01-14 NOTE — Consult Note (Signed)
Diana Mccormick is an 29 y.o. female G2P0A2  RConsult:  R/O PID/TOA  HPI/Hospital course:  Recently Rxed with Bactrim for Cystitis.  Presented in Sepsis-like condition with Hypotension for which she required Dopamine.  Blood culture pending.  Afebrile currently and BPs stable.  Pertinent Gynecological History: Menses: flow is light Contraception: Mirena IUD Blood transfusions: none Sexually transmitted diseases: no past history Previous GYN Procedures: None  Last mammogram: N/A  Last pap: normal, 05/2014. OB History: G2P0A2   Menstrual History:  Patient's last menstrual period was 12/13/2014.    Past Medical History  Diagnosis Date  . Asthma   . Migraine     History reviewed. No pertinent past surgical history.  Family History  Problem Relation Age of Onset  . Hypertension      Social History:  reports that she quit smoking about 11 months ago. She has never used smokeless tobacco. She reports that she does not drink alcohol or use illicit drugs.  Allergies: No Known Allergies, possible Sulfa Allergy.  Prescriptions prior to admission  Medication Sig Dispense Refill Last Dose  . acetaminophen (TYLENOL) 500 MG tablet Take 1,000 mg by mouth every 6 (six) hours as needed for moderate pain.   01/12/2015 at Unknown time  . albuterol (PROVENTIL HFA;VENTOLIN HFA) 108 (90 BASE) MCG/ACT inhaler Inhale 2 puffs into the lungs every 4 (four) hours as needed for wheezing or shortness of breath. 1 Inhaler 5 01/11/2015 at Unknown time  . albuterol (PROVENTIL) (2.5 MG/3ML) 0.083% nebulizer solution Take 3 mLs (2.5 mg total) by nebulization every 4 (four) hours as needed for wheezing. 75 mL 12 01/11/2015 at Unknown time  . loratadine (CLARITIN) 10 MG tablet Take 10 mg by mouth daily.   01/12/2015 at Unknown time  . Melatonin 5 MG TABS Take 5 mg by mouth at bedtime.   01/11/2015 at Unknown time  . naproxen sodium (ANAPROX) 220 MG tablet Take 220 mg by mouth 2 (two) times daily as needed  (for pain).   Past Month at Unknown time  . Phendimetrazine Tartrate 35 MG TABS Take 70 mg by mouth 2 (two) times daily.   01/12/2015 at Unknown time  . phenol (CHLORASEPTIC) 1.4 % LIQD Use as directed 1 spray in the mouth or throat as needed for throat irritation / pain.   01/11/2015 at Unknown time  . VENTOLIN HFA 108 (90 BASE) MCG/ACT inhaler INHALE 2 PUFFS INTO THE LUNGS EVERY 6 HOURS AS NEEDED FOR WHEEZING. 18 g 6 01/11/2015 at Unknown time  . fluconazole (DIFLUCAN) 150 MG tablet Take 150 mg by mouth once.   past month  . ipratropium-albuterol (DUONEB) 0.5-2.5 (3) MG/3ML SOLN Take 3 mLs by nebulization every 6 (six) hours as needed. 360 mL 1 Taking  . oseltamivir (TAMIFLU) 75 MG capsule Take 75 mg by mouth once.   past month  . sulfamethoxazole-trimethoprim (BACTRIM DS,SEPTRA DS) 800-160 MG tablet Take 1 tablet by mouth once.   01/12/2015    ROS neg  Blood pressure 118/80, pulse 93, temperature 97.8 F (36.6 C), temperature source Oral, resp. rate 22, height 5\' 6"  (1.676 m), weight 172 lb 2.9 oz (78.1 kg), last menstrual period 12/13/2014, SpO2 98 %. Physical Exam   Abdo: soft, NT Gyn exam:  Cervix NT to mobilization.  Uterus AV, NT.  No adnexal mass felt, NT.                     Normal vaginal secretions.  Gono neg Chlam neg  Wet  prep BV present  Blood cultures pending  Pelvic US:  Uterus AV normal with a 2 cm myoma (stable c/w [place] at office 2015).  IUD in normal position.  Endometrial line normal.  Left ovary wnl.  Right ovary with a small 1.7 complex cyst, probably hemorrhagic cyst.   Assessment/Plan: No evidence of PID or TOA.  Probably small fctal Rt hemorrhagic ovarian cyst.  IUD in good IU position.  BV covered by ABTX already received.  F/U with me at [facility] when due for Aex with Pelvic US to confirm continued good position of IUD (strings not felt).  Diana Mccormick,MARIE-LYNE 01/14/2015, 1:23 PM

## 2015-01-15 ENCOUNTER — Emergency Department (HOSPITAL_COMMUNITY): Payer: 59

## 2015-01-15 ENCOUNTER — Encounter (HOSPITAL_COMMUNITY): Payer: Self-pay | Admitting: Emergency Medicine

## 2015-01-15 ENCOUNTER — Emergency Department (HOSPITAL_COMMUNITY)
Admission: EM | Admit: 2015-01-15 | Discharge: 2015-01-15 | Disposition: A | Discharge status: Home / Self Care | Payer: 59 | Attending: Emergency Medicine | Admitting: Emergency Medicine

## 2015-01-15 DIAGNOSIS — Z87891 Personal history of nicotine dependence: Secondary | ICD-10-CM | POA: Insufficient documentation

## 2015-01-15 DIAGNOSIS — J45901 Unspecified asthma with (acute) exacerbation: Secondary | ICD-10-CM | POA: Diagnosis not present

## 2015-01-15 DIAGNOSIS — Z8679 Personal history of other diseases of the circulatory system: Secondary | ICD-10-CM | POA: Insufficient documentation

## 2015-01-15 DIAGNOSIS — Z79899 Other long term (current) drug therapy: Secondary | ICD-10-CM | POA: Insufficient documentation

## 2015-01-15 DIAGNOSIS — J90 Pleural effusion, not elsewhere classified: Secondary | ICD-10-CM | POA: Insufficient documentation

## 2015-01-15 DIAGNOSIS — R079 Chest pain, unspecified: Secondary | ICD-10-CM | POA: Diagnosis present

## 2015-01-15 DIAGNOSIS — R0600 Dyspnea, unspecified: Secondary | ICD-10-CM

## 2015-01-15 LAB — CBC
HCT: 28.9 % — ABNORMAL LOW (ref 36.0–46.0)
Hemoglobin: 9.5 g/dL — ABNORMAL LOW (ref 12.0–15.0)
MCH: 28.4 pg (ref 26.0–34.0)
MCHC: 32.9 g/dL (ref 30.0–36.0)
MCV: 86.5 fL (ref 78.0–100.0)
PLATELETS: 155 10*3/uL (ref 150–400)
RBC: 3.34 MIL/uL — AB (ref 3.87–5.11)
RDW: 14.1 % (ref 11.5–15.5)
WBC: 3 10*3/uL — AB (ref 4.0–10.5)

## 2015-01-15 LAB — BASIC METABOLIC PANEL
Anion gap: 7 (ref 5–15)
BUN: 6 mg/dL (ref 6–20)
CALCIUM: 8.5 mg/dL — AB (ref 8.9–10.3)
CHLORIDE: 113 mmol/L — AB (ref 101–111)
CO2: 21 mmol/L — ABNORMAL LOW (ref 22–32)
CREATININE: 0.59 mg/dL (ref 0.44–1.00)
Glucose, Bld: 97 mg/dL (ref 65–99)
Potassium: 3.6 mmol/L (ref 3.5–5.1)
Sodium: 141 mmol/L (ref 135–145)

## 2015-01-15 LAB — I-STAT TROPONIN, ED: TROPONIN I, POC: 0 ng/mL (ref 0.00–0.08)

## 2015-01-15 MED ORDER — FUROSEMIDE 10 MG/ML IJ SOLN
20.0000 mg | Freq: Once | INTRAMUSCULAR | Status: AC
  Administered 2015-01-15: 20 mg via INTRAVENOUS
  Filled 2015-01-15: qty 4

## 2015-01-15 MED ORDER — LEVOFLOXACIN 500 MG PO TABS: 500.0000 mg | ORAL_TABLET | Freq: Every day | ORAL | Status: DC

## 2015-01-15 MED ORDER — FUROSEMIDE 20 MG PO TABS: 20.0000 mg | ORAL_TABLET | Freq: Every day | ORAL | Status: DC

## 2015-01-15 MED ORDER — IOHEXOL 350 MG/ML SOLN
100.0000 mL | Freq: Once | INTRAVENOUS | Status: AC | PRN
  Administered 2015-01-15: 100 mL via INTRAVENOUS

## 2015-01-15 NOTE — ED Notes (Signed)
AVS explained in detail. Knows to follow up with PCP in one week. Knows to take entire Lasix and Levaquin regimen. No other c/c.

## 2015-01-15 NOTE — ED Provider Notes (Signed)
CSN: [id number]     Arrival date & time 01/15/15  0845 History   First MD Initiated Contact with Patient 01/15/15 [phone]     Chief Complaint  Patient presents with  . Shortness of Breath  . Chest Pain    HPI  Patient presents one day after d/c from our affiliated facility.  She notes that since D/C she has had worsening dyspnea, particularly with exertion and supine / prone positioning.  No new cough, f/c.  No new emesis.  Patient was hospitalized three days ago after initially developed dyspnea, mild abdominal pain.  She was treated for presumed sepsis and received >7L NS.  She was also on pressors 24h ago.  In hindsight, patient states that her Sx were c/w prior possible allergic reactions.  Today, no clear alleviating (beyond upright positioning) or exacerbating conditions.  Past Medical History  Diagnosis Date  . Asthma   . Migraine    History reviewed. No pertinent past surgical history. Family History  Problem Relation Age of Onset  . Hypertension     Social History  Substance Use Topics  . Smoking status: Former Smoker    Quit date: 02/05/2014  . Smokeless tobacco: Never Used  . Alcohol Use: No   OB History    No data available     Review of Systems  Constitutional:       Per HPI, otherwise negative  HENT:       Per HPI, otherwise negative  Respiratory:       Per HPI, otherwise negative  Cardiovascular:       Per HPI, otherwise negative  Gastrointestinal: Negative for vomiting.  Endocrine:       Negative aside from HPI  Genitourinary:       Neg aside from HPI   Musculoskeletal:       Per HPI, otherwise negative  Skin: Negative.   Neurological: Negative for syncope.      Allergies  Bactrim and Sulfur  Home Medications   Prior to Admission medications   Medication Sig Start Date End Date Taking? Authorizing Provider  acetaminophen (TYLENOL) 500 MG tablet Take 1,000 mg by mouth every 6 (six) hours as needed for moderate pain.   Yes Historical  Provider, MD  albuterol (PROVENTIL HFA;VENTOLIN HFA) 108 (90 BASE) MCG/ACT inhaler Inhale 2 puffs into the lungs every 4 (four) hours as needed for wheezing or shortness of breath. 09/25/14  Yes Marletta Lor, MD  albuterol (PROVENTIL) (2.5 MG/3ML) 0.083% nebulizer solution Take 3 mLs (2.5 mg total) by nebulization every 4 (four) hours as needed for wheezing. 08/29/13  Yes Marletta Lor, MD  ibuprofen (ADVIL,MOTRIN) 200 MG tablet Take 800 mg by mouth every 6 (six) hours as needed for moderate pain.   Yes Historical Provider, MD  loratadine (CLARITIN) 10 MG tablet Take 10 mg by mouth daily.   Yes Historical Provider, MD  Melatonin 5 MG TABS Take 5 mg by mouth at bedtime.   Yes Historical Provider, MD  naproxen sodium (ANAPROX) 220 MG tablet Take 220 mg by mouth 2 (two) times daily as needed (for pain).   Yes Historical Provider, MD  phenol (CHLORASEPTIC) 1.4 % LIQD Use as directed 1 spray in the mouth or throat as needed for throat irritation / pain.   Yes Historical Provider, MD  cefUROXime (CEFTIN) 500 MG tablet Take 1 tablet (500 mg total) by mouth 2 (two) times daily with a meal. Patient not taking: Reported on 01/15/2015 01/14/15   Kimberlee Nearing  Thereasa Solo, MD  fluconazole (DIFLUCAN) 150 MG tablet Take 1 tablet (150 mg total) by mouth once. Patient not taking: Reported on 01/15/2015 01/15/15   Cherene Altes, MD  furosemide (LASIX) 20 MG tablet Take 1 tablet (20 mg total) by mouth daily. 01/16/15 01/18/15  Carmin Muskrat, MD  levofloxacin (LEVAQUIN) 500 MG tablet Take 1 tablet (500 mg total) by mouth daily. 01/15/15   Carmin Muskrat, MD   BP 128/84 mmHg  Pulse 70  Temp(Src) 98.7 F (37.1 C) (Oral)  Resp 20  SpO2 100%  LMP 12/13/2014 Physical Exam  Constitutional: She is oriented to person, place, and time. She appears well-developed and well-nourished. No distress.  HENT:  Head: Normocephalic and atraumatic.  Eyes: Conjunctivae and EOM are normal.  Cardiovascular: Normal rate and  regular rhythm.   Pulmonary/Chest: No stridor. Tachypnea noted. She has decreased breath sounds.  Abdominal: She exhibits no distension.  Musculoskeletal: She exhibits edema.  Neurological: She is alert and oriented to person, place, and time. No cranial nerve deficit.  Skin: Skin is warm and dry.  Psychiatric: She has a normal mood and affect.  Nursing note and vitals reviewed.   ED Course  Procedures (including critical care time) Labs Review Labs Reviewed  BASIC METABOLIC PANEL - Abnormal; Notable for the following:    Chloride 113 (*)    CO2 21 (*)    Calcium 8.5 (*)    All other components within normal limits  CBC - Abnormal; Notable for the following:    WBC 3.0 (*)    RBC 3.34 (*)    Hemoglobin 9.5 (*)    HCT 28.9 (*)    All other components within normal limits  I-STAT TROPOININ, ED    Imaging Review Dg Chest 2 View  01/15/2015   CLINICAL DATA:  Shortness of breath, chest pain.  EXAM: CHEST  2 VIEW  COMPARISON:  January 12, 2015.  FINDINGS: The heart size and mediastinal contours are within normal limits. No pneumothorax is noted. Left lung is clear. Right basilar opacity is noted concerning for pneumonia or edema. Minimal right pleural effusion may be present. The visualized skeletal structures are unremarkable.  IMPRESSION: Right basilar opacity concerning for pneumonia or edema with probable minimal right pleural effusion.   Electronically Signed   By: Marijo Conception, M.D.   On: 01/15/2015 09:23   Ct Angio Chest Pe W/cm &/or Wo Cm  01/15/2015   CLINICAL DATA:  Chest pain, dyspnea, pleural effusion and shortness of breath since last evening.  EXAM: CT ANGIOGRAPHY CHEST WITH CONTRAST  TECHNIQUE: Multidetector CT imaging of the chest was performed using the standard protocol during bolus administration of intravenous contrast. Multiplanar CT image reconstructions and MIPs were obtained to evaluate the vascular anatomy.  CONTRAST:  158mL OMNIPAQUE IOHEXOL 350 MG/ML SOLN   COMPARISON:  Chest x-ray same date.  FINDINGS: Mediastinum/Nodes: No breast masses, supraclavicular or axillary lymphadenopathy. Small scattered lymph nodes are noted. The thyroid gland is grossly normal.  The heart is normal in size. No pericardial effusion. Fluid noted in the pericardial recesses. The aorta is normal in caliber. No dissection. The branch vessels are patent. Borderline mediastinal and hilar lymph nodes are most likely inflammatory or hyperplastic.  Fairly marked mid distal esophageal wall thickening versus periesophageal inflammation/fluid.  The pulmonary arterial tree is well opacified. No filling defects to suggest pulmonary embolism.  Lungs/Pleura: There is a small to moderate-sized right pleural effusion and a small left pleural effusion. Patchy airspace opacities and both  lower lobes suspicious for infiltrates. Similar findings in the right middle lobe appear more like atelectasis. No worrisome pulmonary lesions. No pulmonary edema.  Upper abdomen: No significant findings.  Musculoskeletal: No significant bony findings.  Review of the MIP images confirms the above findings.  IMPRESSION: 1. No CT findings for pulmonary embolism. 2. Normal thoracic aorta. 3. Bilateral pleural effusions, right greater than left with patchy bilateral infiltrates and atelectasis. 4. Borderline enlarged mediastinal and hilar lymph nodes are likely inflammatory/hyperplastic. There is also either fluid/edema around the esophagus or involving the esophageal wall. Recommend correlation with clinical symptoms of dysphagia/ reflux esophagitis.   Electronically Signed   By: Marijo Sanes M.D.   On: 01/15/2015 11:51   I have personally reviewed and evaluated these images and lab results as part of my medical decision-making.   EKG Interpretation   Date/Time:  Thursday January 15 2015 09:07:02 EDT Ventricular Rate:  81 PR Interval:  156 QRS Duration: 69 QT Interval:  357 QTC Calculation: 414 R Axis:   67 Text  Interpretation:  Sinus rhythm Low voltage, precordial leads  Borderline T abnormalities, anterior leads Sinus rhythm T wave abnormality  Abnormal ekg Confirmed by Carmin Muskrat  MD ([id number]) on 01/15/2015  10:15:52 AM     O2- 94%ra, abn, corrects to 100% w O2  EMR review is c/w the Hx provided by the patient.  On re-eval we discussed initial labs.  With persistent tachypnea, and no e/o prior PE eval, the patient requests this, which is reasonable and remains on the differential, given her hypoxia, recent hypotension and persistent tachypnea.   On re-eval we reviewed imaging together, the need for close monitoring. MDM   Final diagnoses:  Dyspnea  Pleural effusion, bilateral   Annamaria Boots F presents one day after hospital d/c w worsening dyspnea.  Today, no e/o PE, and though there is pulmonary congestion, she is without cough, f/c and these findings are more c/w iatrogenic fluid overload. Multiple lengthy conversations conducted w patient and family.  She was started on a brief course of lasix and d/c in stable condition.   Carmin Muskrat, MD 01/15/15 2150

## 2015-01-15 NOTE — ED Notes (Signed)
Pt comes in for [facility] and chest heaviness that started last night when she tried to lay down around 8pm. Pt was discharged from [facility] around 5 pm yesterday for allergic reaction. Pt states that she used neb treatment and inhaler but not helping with shob.0

## 2015-01-15 NOTE — ED Notes (Signed)
Pt in radiology 

## 2015-01-15 NOTE — Discharge Instructions (Signed)
As discussed, with your new dyspnea, please take all medication as directed, and monitor your condition carefully.  If you develop any symptoms consistent with pneumonia, please fill your antibiotic prescription.  Otherwise, please take all medication as directed, and be sure to follow-up with your physician in one week to ensure that your condition is appropriately improving.

## 2015-01-16 ENCOUNTER — Emergency Department (HOSPITAL_COMMUNITY): Payer: 59

## 2015-01-16 ENCOUNTER — Encounter (HOSPITAL_COMMUNITY): Payer: Self-pay | Admitting: Emergency Medicine

## 2015-01-16 ENCOUNTER — Emergency Department (HOSPITAL_COMMUNITY)
Admission: EM | Admit: 2015-01-16 | Discharge: 2015-01-16 | Disposition: A | Discharge status: Home / Self Care | Payer: 59 | Attending: Emergency Medicine | Admitting: Emergency Medicine

## 2015-01-16 DIAGNOSIS — E8770 Fluid overload, unspecified: Secondary | ICD-10-CM | POA: Insufficient documentation

## 2015-01-16 DIAGNOSIS — Z87891 Personal history of nicotine dependence: Secondary | ICD-10-CM | POA: Diagnosis not present

## 2015-01-16 DIAGNOSIS — R220 Localized swelling, mass and lump, head: Secondary | ICD-10-CM | POA: Diagnosis present

## 2015-01-16 DIAGNOSIS — R51 Headache: Secondary | ICD-10-CM | POA: Diagnosis not present

## 2015-01-16 DIAGNOSIS — Z3202 Encounter for pregnancy test, result negative: Secondary | ICD-10-CM | POA: Insufficient documentation

## 2015-01-16 DIAGNOSIS — Z8679 Personal history of other diseases of the circulatory system: Secondary | ICD-10-CM | POA: Diagnosis not present

## 2015-01-16 DIAGNOSIS — J45901 Unspecified asthma with (acute) exacerbation: Secondary | ICD-10-CM | POA: Diagnosis not present

## 2015-01-16 DIAGNOSIS — Z79899 Other long term (current) drug therapy: Secondary | ICD-10-CM | POA: Diagnosis not present

## 2015-01-16 LAB — I-STAT TROPONIN, ED: Troponin i, poc: 0 ng/mL (ref 0.00–0.08)

## 2015-01-16 LAB — CBC
HCT: 32.9 % — ABNORMAL LOW (ref 36.0–46.0)
Hemoglobin: 10.9 g/dL — ABNORMAL LOW (ref 12.0–15.0)
MCH: 28.8 pg (ref 26.0–34.0)
MCHC: 33.1 g/dL (ref 30.0–36.0)
MCV: 86.8 fL (ref 78.0–100.0)
PLATELETS: 190 10*3/uL (ref 150–400)
RBC: 3.79 MIL/uL — AB (ref 3.87–5.11)
RDW: 13.8 % (ref 11.5–15.5)
WBC: 4 10*3/uL (ref 4.0–10.5)

## 2015-01-16 LAB — COMPREHENSIVE METABOLIC PANEL
ALK PHOS: 58 U/L (ref 38–126)
ALT: 74 U/L — ABNORMAL HIGH (ref 14–54)
ANION GAP: 6 (ref 5–15)
AST: 48 U/L — ABNORMAL HIGH (ref 15–41)
Albumin: 4.2 g/dL (ref 3.5–5.0)
BILIRUBIN TOTAL: 0.5 mg/dL (ref 0.3–1.2)
BUN: 7 mg/dL (ref 6–20)
CALCIUM: 9.2 mg/dL (ref 8.9–10.3)
CO2: 23 mmol/L (ref 22–32)
Chloride: 110 mmol/L (ref 101–111)
Creatinine, Ser: 0.64 mg/dL (ref 0.44–1.00)
Glucose, Bld: 93 mg/dL (ref 65–99)
POTASSIUM: 3.6 mmol/L (ref 3.5–5.1)
Sodium: 139 mmol/L (ref 135–145)
TOTAL PROTEIN: 7.4 g/dL (ref 6.5–8.1)

## 2015-01-16 LAB — I-STAT BETA HCG BLOOD, ED (MC, WL, AP ONLY): I-stat hCG, quantitative: <5 m[IU]/mL (ref <5)

## 2015-01-16 LAB — BRAIN NATRIURETIC PEPTIDE: B NATRIURETIC PEPTIDE 5: 169.1 pg/mL — AB (ref 0.0–100.0)

## 2015-01-16 MED ORDER — FUROSEMIDE 20 MG PO TABS: 40.0000 mg | ORAL_TABLET | Freq: Every day | ORAL | Status: DC

## 2015-01-16 MED ORDER — TRAMADOL HCL 50 MG PO TABS
50.0000 mg | ORAL_TABLET | ORAL | Status: AC
  Administered 2015-01-16: 50 mg via ORAL
  Filled 2015-01-16: qty 1

## 2015-01-16 MED ORDER — FUROSEMIDE 10 MG/ML IJ SOLN
40.0000 mg | Freq: Once | INTRAMUSCULAR | Status: AC
  Administered 2015-01-16: 40 mg via INTRAMUSCULAR
  Filled 2015-01-16: qty 4

## 2015-01-16 MED ORDER — POTASSIUM CHLORIDE CRYS ER 10 MEQ PO TBCR
10.0000 meq | EXTENDED_RELEASE_TABLET | Freq: Two times a day (BID) | ORAL | Status: DC
  Administered 2015-01-16: 10 meq via ORAL
  Filled 2015-01-16: qty 1

## 2015-01-16 NOTE — ED Notes (Signed)
Pt refused w/c, ambulatory with mother, steady gait.

## 2015-01-16 NOTE — Discharge Instructions (Signed)
Shortness of Breath Your shortness of breath seems related to the amount of fluid you received this week.  Your xray is improving.  Your vitals are stable and your respiratory examination was reassuring.  Follow up with your PCP as soon as possible.  Take 40 mg of lasix tomorrow and then again on Sunday.  Return for acute change in symptoms or worsening symptoms.  Shortness of breath means you have trouble breathing. It could also mean that you have a medical problem. You should get immediate medical care for shortness of breath. CAUSES   Not enough oxygen in the air such as with high altitudes or a smoke-filled room.  Certain lung diseases, infections, or problems.  Heart disease or conditions, such as angina or heart failure.  Low red blood cells (anemia).  Poor physical fitness, which can cause shortness of breath when you exercise.  Chest or back injuries or stiffness.  Being overweight.  Smoking.  Anxiety, which can make you feel like you are not getting enough air. DIAGNOSIS  Serious medical problems can often be found during your physical exam. Tests may also be done to determine why you are having shortness of breath. Tests may include:  Chest X-rays.  Lung function tests.  Blood tests.  An electrocardiogram (ECG).  An ambulatory electrocardiogram. An ambulatory ECG records your heartbeat patterns over a 24-hour period.  Exercise testing.  A transthoracic echocardiogram (TTE). During echocardiography, sound waves are used to evaluate how blood flows through your heart.  A transesophageal echocardiogram (TEE).  Imaging scans. Your health care provider may not be able to find a cause for your shortness of breath after your exam. In this case, it is important to have a follow-up exam with your health care provider as directed.  TREATMENT  Treatment for shortness of breath depends on the cause of your symptoms and can vary greatly. HOME CARE INSTRUCTIONS   Do not  smoke. Smoking is a common cause of shortness of breath. If you smoke, ask for help to quit.  Avoid being around chemicals or things that may bother your breathing, such as paint fumes and dust.  Rest as needed. Slowly resume your usual activities.  If medicines were prescribed, take them as directed for the full length of time directed. This includes oxygen and any inhaled medicines.  Keep all follow-up appointments as directed by your health care provider. SEEK MEDICAL CARE IF:   Your condition does not improve in the time expected.  You have a hard time doing your normal activities even with rest.  You have any new symptoms. SEEK IMMEDIATE MEDICAL CARE IF:   Your shortness of breath gets worse.  You feel light-headed, faint, or develop a cough not controlled with medicines.  You start coughing up blood.  You have pain with breathing.  You have chest pain or pain in your arms, shoulders, or abdomen.  You have a fever.  You are unable to walk up stairs or exercise the way you normally do. MAKE SURE YOU:  Understand these instructions.  Will watch your condition.  Will get help right away if you are not doing well or get worse.   This information is not intended to replace advice given to you by your health care provider. Make sure you discuss any questions you have with your health care provider.   Document Released: 12/21/2000 Document Revised: 04/02/2013 Document Reviewed: 06/13/2011 Elsevier Interactive Patient Education [employer].

## 2015-01-16 NOTE — ED Notes (Signed)
Pt recently discharged from [facility] with unknown cause. Pt presents with facial swelling and headache beginning this morning. A/O x4.

## 2015-01-16 NOTE — ED Provider Notes (Signed)
CSN: [id number]     Arrival date & time 01/16/15  0721 History   First MD Initiated Contact with Patient 01/16/15 0737     No chief complaint on file.    (Consider location/radiation/quality/duration/timing/severity/associated sxs/prior Treatment) HPI Comments: 29 y.o. Female with history of asthma, migraines presents for shortness of breath, headache, facial swelling.  The patient has had a medically complicated week.  She was seen a few days ago and admitted to the hospital for presumed sepsis without an infectious cause found.  The patient was aggressively fluid resuscitated for hypotension and was briefly on Dopamine for blood pressure support.  The patient believes that this episode was actually an allergic reaction to Bactrim.  Patient reports that since this time she has not been able to sleep and is worried to go to sleep because she is short of breath and knows that this is because of all the fluid in her body.  She is worried that she now has fluid around her brain and says that her face is still swollen from all of the fluid.  She was seen yesterday for shortness of breath and diagnosed with fluid overload and started on lasix after an otherwise negative work up including CT for PE.  Patient is concerned that she is not urinating enough for taking lasix and feels it is not working appropriately.  She admits to being very traumatized after the experience and feels very anxious.   Past Medical History  Diagnosis Date  . Asthma   . Migraine    No past surgical history on file. Family History  Problem Relation Age of Onset  . Hypertension     Social History  Substance Use Topics  . Smoking status: Former Smoker    Quit date: 02/05/2014  . Smokeless tobacco: Never Used  . Alcohol Use: No   OB History    No data available     Review of Systems  Constitutional: Negative for fever, chills, appetite change and fatigue.  HENT: Positive for facial swelling.   Eyes: Negative for  pain, redness and visual disturbance.  Respiratory: Positive for shortness of breath. Negative for cough, chest tightness and wheezing.   Cardiovascular: Negative for chest pain and palpitations.  Gastrointestinal: Negative for nausea, vomiting, abdominal pain and diarrhea.  Genitourinary: Positive for decreased urine volume. Negative for dysuria, urgency and hematuria.  Musculoskeletal: Negative for myalgias and back pain.  Skin: Negative for rash.  Neurological: Positive for headaches. Negative for dizziness, seizures, syncope, facial asymmetry, speech difficulty, weakness and numbness.      Allergies  Bactrim and Sulfur  Home Medications   Prior to Admission medications   Medication Sig Start Date End Date Taking? Authorizing Provider  acetaminophen (TYLENOL) 500 MG tablet Take 1,000 mg by mouth every 6 (six) hours as needed for moderate pain.    Historical Provider, MD  albuterol (PROVENTIL HFA;VENTOLIN HFA) 108 (90 BASE) MCG/ACT inhaler Inhale 2 puffs into the lungs every 4 (four) hours as needed for wheezing or shortness of breath. 09/25/14   Marletta Lor, MD  albuterol (PROVENTIL) (2.5 MG/3ML) 0.083% nebulizer solution Take 3 mLs (2.5 mg total) by nebulization every 4 (four) hours as needed for wheezing. 08/29/13   Marletta Lor, MD  cefUROXime (CEFTIN) 500 MG tablet Take 1 tablet (500 mg total) by mouth 2 (two) times daily with a meal. Patient not taking: Reported on 01/15/2015 01/14/15   Cherene Altes, MD  fluconazole (DIFLUCAN) 150 MG tablet Take 1 tablet (  150 mg total) by mouth once. Patient not taking: Reported on 01/15/2015 01/15/15   Cherene Altes, MD  furosemide (LASIX) 20 MG tablet Take 1 tablet (20 mg total) by mouth daily. 01/16/15 01/18/15  Carmin Muskrat, MD  ibuprofen (ADVIL,MOTRIN) 200 MG tablet Take 800 mg by mouth every 6 (six) hours as needed for moderate pain.    Historical Provider, MD  levofloxacin (LEVAQUIN) 500 MG tablet Take 1 tablet (500 mg  total) by mouth daily. 01/15/15   Carmin Muskrat, MD  loratadine (CLARITIN) 10 MG tablet Take 10 mg by mouth daily.    Historical Provider, MD  Melatonin 5 MG TABS Take 5 mg by mouth at bedtime.    Historical Provider, MD  naproxen sodium (ANAPROX) 220 MG tablet Take 220 mg by mouth 2 (two) times daily as needed (for pain).    Historical Provider, MD  phenol (CHLORASEPTIC) 1.4 % LIQD Use as directed 1 spray in the mouth or throat as needed for throat irritation / pain.    Historical Provider, MD   BP 133/94 mmHg  Pulse 70  Temp(Src) 98.5 F (36.9 C) (Oral)  Resp 22  SpO2 100%  LMP 12/13/2014 Physical Exam  Constitutional: She is oriented to person, place, and time. She appears well-developed and well-nourished. No distress.  HENT:  Head: Normocephalic and atraumatic.  Right Ear: External ear normal.  Left Ear: External ear normal.  Nose: Nose normal.  Mouth/Throat: Oropharynx is clear and moist. No oropharyngeal exudate.  Eyes: EOM are normal. Pupils are equal, round, and reactive to light.  Neck: Normal range of motion. Neck supple.  Cardiovascular: Normal rate, regular rhythm, normal heart sounds and intact distal pulses.   No murmur heard. Pulmonary/Chest: Effort normal. No respiratory distress. She has no wheezes. She has no rales.  Abdominal: Soft. She exhibits no distension. There is no tenderness.  Musculoskeletal: Normal range of motion. She exhibits no edema or tenderness.  Neurological: She is alert and oriented to person, place, and time. She has normal strength. No cranial nerve deficit or sensory deficit. She exhibits normal muscle tone. Coordination normal.  Skin: Skin is warm and dry. No rash noted. She is not diaphoretic.  Vitals reviewed.   ED Course  Procedures (including critical care time) Labs Review Labs Reviewed  COMPREHENSIVE METABOLIC PANEL  BRAIN NATRIURETIC PEPTIDE  CBC  I-STAT BETA HCG BLOOD, ED ([facility], [facility], [facility] ONLY)  I-STAT TROPOININ, ED    Imaging  Review Dg Chest 2 View  01/15/2015   CLINICAL DATA:  Shortness of breath, chest pain.  EXAM: CHEST  2 VIEW  COMPARISON:  January 12, 2015.  FINDINGS: The heart size and mediastinal contours are within normal limits. No pneumothorax is noted. Left lung is clear. Right basilar opacity is noted concerning for pneumonia or edema. Minimal right pleural effusion may be present. The visualized skeletal structures are unremarkable.  IMPRESSION: Right basilar opacity concerning for pneumonia or edema with probable minimal right pleural effusion.   Electronically Signed   By: Marijo Conception, M.D.   On: 01/15/2015 09:23   Ct Angio Chest Pe W/cm &/or Wo Cm  01/15/2015   CLINICAL DATA:  Chest pain, dyspnea, pleural effusion and shortness of breath since last evening.  EXAM: CT ANGIOGRAPHY CHEST WITH CONTRAST  TECHNIQUE: Multidetector CT imaging of the chest was performed using the standard protocol during bolus administration of intravenous contrast. Multiplanar CT image reconstructions and MIPs were obtained to evaluate the vascular anatomy.  CONTRAST:  160mL OMNIPAQUE  IOHEXOL 350 MG/ML SOLN  COMPARISON:  Chest x-ray same date.  FINDINGS: Mediastinum/Nodes: No breast masses, supraclavicular or axillary lymphadenopathy. Small scattered lymph nodes are noted. The thyroid gland is grossly normal.  The heart is normal in size. No pericardial effusion. Fluid noted in the pericardial recesses. The aorta is normal in caliber. No dissection. The branch vessels are patent. Borderline mediastinal and hilar lymph nodes are most likely inflammatory or hyperplastic.  Fairly marked mid distal esophageal wall thickening versus periesophageal inflammation/fluid.  The pulmonary arterial tree is well opacified. No filling defects to suggest pulmonary embolism.  Lungs/Pleura: There is a small to moderate-sized right pleural effusion and a small left pleural effusion. Patchy airspace opacities and both lower lobes suspicious for infiltrates.  Similar findings in the right middle lobe appear more like atelectasis. No worrisome pulmonary lesions. No pulmonary edema.  Upper abdomen: No significant findings.  Musculoskeletal: No significant bony findings.  Review of the MIP images confirms the above findings.  IMPRESSION: 1. No CT findings for pulmonary embolism. 2. Normal thoracic aorta. 3. Bilateral pleural effusions, right greater than left with patchy bilateral infiltrates and atelectasis. 4. Borderline enlarged mediastinal and hilar lymph nodes are likely inflammatory/hyperplastic. There is also either fluid/edema around the esophagus or involving the esophageal wall. Recommend correlation with clinical symptoms of dysphagia/ reflux esophagitis.   Electronically Signed   By: Marijo Sanes M.D.   On: 01/15/2015 11:51   I have personally reviewed and evaluated these images and lab results as part of my medical decision-making.   EKG Interpretation None      MDM  Patient was seen and evaluated in stable condition.  Patient anxious about events of week.  Benign examination with mild edema noted.  Normal respiratory examination.  Stable vitals.  BNP mildly elevated other laboratory studies unremarkable.  Chest xray improved.  Discussed with patient normal neurologic examination and cognition.  Patient reports she has not slept since she was admitted to the hospital, likely cause of generalized headache.  Patient was given 40 mg IM lasix and given two more days worth of lasix to take at home.  She was instructed to make a follow up appointment with her PCP for Monday.  She expressed understanding and agreement with plan of care.  Patient appeared reassured but was given strict return precautions. Final diagnoses:  None    1. Fluid overload  2. Anxiety    Harvel Quale, MD 01/16/15 2224

## 2015-01-16 NOTE — ED Notes (Signed)
MD at bedside. 

## 2015-01-16 NOTE — ED Notes (Signed)
Bed: WA20 Expected date:  Expected time:  Means of arrival:  Comments: 

## 2015-01-17 LAB — CULTURE, BLOOD (ROUTINE X 2)
Culture: NO GROWTH
Culture: NO GROWTH

## 2015-01-20 ENCOUNTER — Ambulatory Visit: Payer: Self-pay | Admitting: Internal Medicine

## 2015-01-20 ENCOUNTER — Telehealth: Payer: Self-pay | Admitting: Internal Medicine

## 2015-01-20 DIAGNOSIS — R945 Abnormal results of liver function studies: Principal | ICD-10-CM

## 2015-01-20 DIAGNOSIS — R7989 Other specified abnormal findings of blood chemistry: Secondary | ICD-10-CM

## 2015-01-20 NOTE — Telephone Encounter (Signed)
Patient has a hospital follow up scheduled for Friday. States she needs potassium level and LFT's drawn prior to appointment so that Dr Raliegh Ip will have the results. OK for me to call her back and schedule a lab appointment for tomorrow?

## 2015-01-20 NOTE — Telephone Encounter (Signed)
[facility] - Client [place] [facility] Patient Name: Diana Mccormick DOB: July 12, 1985 Initial Comment Caller states she was in the hospital last week. She is having shortness of breath, and chest pain. 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 Nurse Assessment Nurse: Markus Daft, RN, Sherre Poot Date/Time Eilene Ghazi Time): 01/20/2015 1:22:34 PM Confirm and document reason for call. If symptomatic, describe symptoms. ---Caller states she was in the hospital last week and thought it was an allergic reaction. Her SBP dropped to 70/30 and not responding to fluids. Diagnosed with kidney infection. -- Today she started to have heart palpitations. She is having shortness of breath, and chest pain. 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 -- No chest pain now, it is subsiding. Has the patient traveled out of the country within the last 30 days? ---Not Applicable Does the patient have any new or worsening symptoms? ---Yes Will a triage be completed? ---Yes Related visit to physician within the last 2 weeks? ---Yes Does the PT have any chronic conditions? (i.e. diabetes, asthma, etc.) ---No Did the patient indicate they were pregnant? ---No Guidelines Guideline Title Affirmed Question Affirmed Notes Heart Rate and Heartbeat Questions Difficulty breathing weakness over all Final Disposition User Go to ED Now Markus Daft, RN, Windy Comments Refused to go to ER as they "almost just let me die," Just had bad experience. She is going to call 911 to have them check her HR. -- RN advised that I would let her MD know that she does not want to go to ER. She would rather be seen by someone who cares about her. -- RN will send msg and someone will call her back. She verb. understanding. Referrals GO TO FACILITY REFUSED Disagree/Comply: Comply Debra at office aware to contact pt back today.

## 2015-01-20 NOTE — Telephone Encounter (Signed)
Reference to Triage note, pt scheduled for an appt.

## 2015-01-20 NOTE — Telephone Encounter (Signed)
Estill Bamberg, orders put in [facility] for lab draw, please schedule.

## 2015-01-20 NOTE — Telephone Encounter (Signed)
Patient is scheduled for 10/12 at 10am. She is aware.

## 2015-01-20 NOTE — Telephone Encounter (Signed)
[facility] called Diana Mccormick she spoke with this pt  pt  complaining of having heart palpation SOB  weak chest pain advise pt to go to ER , pt do not want to go to the ER  She scared and want appointment

## 2015-01-21 ENCOUNTER — Other Ambulatory Visit (INDEPENDENT_AMBULATORY_CARE_PROVIDER_SITE_OTHER): Payer: 59

## 2015-01-21 DIAGNOSIS — R7989 Other specified abnormal findings of blood chemistry: Secondary | ICD-10-CM | POA: Diagnosis not present

## 2015-01-21 DIAGNOSIS — R945 Abnormal results of liver function studies: Principal | ICD-10-CM

## 2015-01-21 LAB — HEPATIC FUNCTION PANEL
ALT: 38 U/L — AB (ref 0–35)
AST: 22 U/L (ref 0–37)
Albumin: 4.5 g/dL (ref 3.5–5.2)
Alkaline Phosphatase: 54 U/L (ref 39–117)
BILIRUBIN DIRECT: 0.1 mg/dL (ref 0.0–0.3)
Total Bilirubin: 0.4 mg/dL (ref 0.2–1.2)
Total Protein: 8.2 g/dL (ref 6.0–8.3)

## 2015-01-21 LAB — POTASSIUM: POTASSIUM: 4.2 meq/L (ref 3.5–5.1)

## 2015-01-23 ENCOUNTER — Ambulatory Visit (INDEPENDENT_AMBULATORY_CARE_PROVIDER_SITE_OTHER): Payer: 59 | Admitting: Internal Medicine

## 2015-01-23 ENCOUNTER — Encounter: Payer: Self-pay | Admitting: Internal Medicine

## 2015-01-23 ENCOUNTER — Ambulatory Visit: Payer: Self-pay | Admitting: Internal Medicine

## 2015-01-23 VITALS — BP 110/62 | HR 70 | Temp 98.7°F | Ht 66.0 in | Wt 167.5 lb

## 2015-01-23 DIAGNOSIS — G4700 Insomnia, unspecified: Secondary | ICD-10-CM | POA: Diagnosis not present

## 2015-01-23 DIAGNOSIS — J4520 Mild intermittent asthma, uncomplicated: Secondary | ICD-10-CM

## 2015-01-23 DIAGNOSIS — F411 Generalized anxiety disorder: Secondary | ICD-10-CM | POA: Diagnosis not present

## 2015-01-23 NOTE — Patient Instructions (Signed)
It is important that you exercise regularly, at least 20 minutes 3 to 4 times per week.  If you develop chest pain or shortness of breath seek  medical attention.  Call or return to clinic prn if these symptoms worsen or fail to improve as anticipated.   consider behavioral health referral

## 2015-01-23 NOTE — Progress Notes (Signed)
Pre visit review using our clinic review tool, if applicable. No additional management support is needed unless otherwise documented below in the visit note. 

## 2015-01-23 NOTE — Progress Notes (Signed)
Subjective:    Patient ID: Diana Mccormick, female    DOB: Jul 13, 1985, 29 y.o.   MRN: [id number]  HPI  DOB:08/11/1985  Date of Admission: 01/12/2015 Date of Discharge: 01/14/2015  Attending Physician:MCCLUNG,JEFFREY T  Patient's LOV:FIEPPIRJJOA,Diana Pilar Plate, MD  Consults: GYN  Disposition: D/C home w/ mother   Follow-up Appts:     Follow-up Information    Follow up with Nyoka Cowden, MD In 1 week.   Specialty: Internal Medicine   Contact information:   [place] [id number] [phone]       Follow up with [name], MD.   Specialty: Obstetrics and Gynecology   Why: Keep your previously scheduled follow up appointment.    Contact information:   [place] [address] [phone]      Discharge Diagnoses: Early Sepsis - unknown organism  Right ovarian cyst Hypotension - lactic acidosis  Asthma/allergies Yeast vaginitis Bacterial vaginosis  Insomnia Hypomagnesemia Hypoalbuminemia Hypocalcemia     29 year old patient who is seen today following a recent hospital discharge 9 days ago.  She was medically for evaluation of an acute febrile illness.  Since her discharge she has done fairly well, but remains quite anxious with the insomnia issues.  She also describes frequent palpitations.  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 She has also been seen in the ED on both October 6 and October 7  Past Medical History  Diagnosis Date  . Asthma   . Migraine     Social History   Social History  . Marital Status: Single    Spouse Name: N/A  . Number of Children: N/A  . Years of Education: N/A   Occupational History  . Not on file.   Social History Main Topics  . Smoking status: Former Smoker    Quit date: 02/05/2014  . Smokeless tobacco: Never Used  . Alcohol Use: No  . Drug Use: No  . Sexual Activity: Yes    Birth Control/  Protection: IUD   Other Topics Concern  . Not on file   Social History Narrative    No past surgical history on file.  Family History  Problem Relation Age of Onset  . Hypertension      Allergies  Allergen Reactions  . Bactrim [Sulfamethoxazole-Trimethoprim] Anaphylaxis    Hypotension and Fever-like symptoms  . Sulfur Anaphylaxis    Low heart rate    Current Outpatient Prescriptions on File Prior to Visit  Medication Sig Dispense Refill  . albuterol (PROVENTIL HFA;VENTOLIN HFA) 108 (90 BASE) MCG/ACT inhaler Inhale 2 puffs into the lungs every 4 (four) hours as needed for wheezing or shortness of breath. 1 Inhaler 5  . albuterol (PROVENTIL) (2.5 MG/3ML) 0.083% nebulizer solution Take 3 mLs (2.5 mg total) by nebulization every 4 (four) hours as needed for wheezing. 75 mL 12  . furosemide (LASIX) 20 MG tablet Take 2 tablets (40 mg total) by mouth daily. 2 tablet 0   No current facility-administered medications on file prior to visit.    BP 110/62 mmHg  Pulse 70  Temp(Src) 98.7 F (37.1 C) (Oral)  Ht 5\' 6"  (1.676 m)  Wt 167 lb 8 oz (75.978 kg)  BMI 27.05 kg/m2  LMP 01/12/2015      .  Review of Systems  Constitutional: Negative.   HENT: Negative for congestion, dental problem, hearing loss, rhinorrhea, sinus pressure, sore throat and tinnitus.   Eyes: Negative for pain, discharge and visual disturbance.  Respiratory: Negative for cough and shortness of breath.   Cardiovascular: Positive for palpitations. Negative for chest pain and leg swelling.  Gastrointestinal: Negative for nausea, vomiting, abdominal pain, diarrhea, constipation, blood in stool and abdominal distention.  Genitourinary: Negative for dysuria, urgency, frequency, hematuria, flank pain, vaginal bleeding, vaginal discharge, difficulty urinating, vaginal pain and pelvic pain.  Musculoskeletal: Negative for joint swelling, arthralgias and gait problem.  Skin: Negative for rash.  Neurological: Negative  for dizziness, syncope, speech difficulty, weakness, numbness and headaches.  Hematological: Negative for adenopathy.  Psychiatric/Behavioral: Positive for sleep disturbance. Negative for behavioral problems, dysphoric mood and agitation. The patient is nervous/anxious.        Objective:   Physical Exam  Constitutional: She is oriented to person, place, and time. She appears well-developed and well-nourished.  Anxious at times tearful Blood pressure well controlled  HENT:  Head: Normocephalic.  Right Ear: External ear normal.  Left Ear: External ear normal.  Mouth/Throat: Oropharynx is clear and moist.  Eyes: Conjunctivae and EOM are normal. Pupils are equal, round, and reactive to light.  Neck: Normal range of motion. Neck supple. No thyromegaly present.  Cardiovascular: Normal rate, regular rhythm, normal heart sounds and intact distal pulses.   Pulse 70  Pulmonary/Chest: Effort normal and breath sounds normal.  O2 saturation 98%  Abdominal: Soft. Bowel sounds are normal. She exhibits no mass. There is no tenderness.  Musculoskeletal: Normal range of motion.  Lymphadenopathy:    She has no cervical adenopathy.  Neurological: She is alert and oriented to person, place, and time.  Skin: Skin is warm and dry. No rash noted.  Psychiatric: She has a normal mood and affect. Her behavior is normal.          Assessment & Plan:  Status post hospital admission for acute febrile illness Palpitations Situational stress  The patient was given information concerning behavioral health referral.  She will consider over the weekend and make a contact next week.  If she does not feel she is able to return to work.

## 2015-02-11 ENCOUNTER — Other Ambulatory Visit: Payer: Self-pay | Admitting: *Deleted

## 2015-02-11 ENCOUNTER — Ambulatory Visit (INDEPENDENT_AMBULATORY_CARE_PROVIDER_SITE_OTHER): Payer: 59 | Admitting: Psychology

## 2015-02-11 DIAGNOSIS — F40248 Other situational type phobia: Secondary | ICD-10-CM

## 2015-02-11 MED ORDER — ESCITALOPRAM OXALATE 10 MG PO TABS: 10.0000 mg | ORAL_TABLET | Freq: Every day | ORAL | Status: DC

## 2015-02-23 ENCOUNTER — Ambulatory Visit (INDEPENDENT_AMBULATORY_CARE_PROVIDER_SITE_OTHER): Payer: 59 | Admitting: Psychology

## 2015-02-23 DIAGNOSIS — F40248 Other situational type phobia: Secondary | ICD-10-CM

## 2015-03-09 ENCOUNTER — Ambulatory Visit (INDEPENDENT_AMBULATORY_CARE_PROVIDER_SITE_OTHER): Payer: 59 | Admitting: Psychology

## 2015-03-09 DIAGNOSIS — F40248 Other situational type phobia: Secondary | ICD-10-CM | POA: Diagnosis not present

## 2015-03-19 ENCOUNTER — Ambulatory Visit: Payer: 59 | Admitting: Psychology

## 2015-03-23 ENCOUNTER — Ambulatory Visit: Payer: 59 | Admitting: Psychology

## 2015-03-30 ENCOUNTER — Ambulatory Visit: Payer: 59 | Admitting: Psychology

## 2015-10-20 ENCOUNTER — Inpatient Hospital Stay (HOSPITAL_COMMUNITY)
Admission: AD | Admit: 2015-10-20 | Discharge: 2015-10-20 | Disposition: A | Discharge status: Home / Self Care | Payer: 59 | Source: Ambulatory Visit | Attending: Obstetrics and Gynecology | Admitting: Obstetrics and Gynecology

## 2015-10-20 ENCOUNTER — Inpatient Hospital Stay (HOSPITAL_COMMUNITY): Payer: 59

## 2015-10-20 ENCOUNTER — Encounter (HOSPITAL_COMMUNITY): Payer: Self-pay | Admitting: *Deleted

## 2015-10-20 DIAGNOSIS — Z882 Allergy status to sulfonamides status: Secondary | ICD-10-CM | POA: Insufficient documentation

## 2015-10-20 DIAGNOSIS — Z79899 Other long term (current) drug therapy: Secondary | ICD-10-CM | POA: Diagnosis not present

## 2015-10-20 DIAGNOSIS — Z87891 Personal history of nicotine dependence: Secondary | ICD-10-CM | POA: Diagnosis not present

## 2015-10-20 DIAGNOSIS — D259 Leiomyoma of uterus, unspecified: Secondary | ICD-10-CM | POA: Insufficient documentation

## 2015-10-20 DIAGNOSIS — R1030 Lower abdominal pain, unspecified: Secondary | ICD-10-CM | POA: Diagnosis not present

## 2015-10-20 DIAGNOSIS — Z975 Presence of (intrauterine) contraceptive device: Secondary | ICD-10-CM | POA: Insufficient documentation

## 2015-10-20 DIAGNOSIS — J45909 Unspecified asthma, uncomplicated: Secondary | ICD-10-CM | POA: Insufficient documentation

## 2015-10-20 DIAGNOSIS — Z881 Allergy status to other antibiotic agents status: Secondary | ICD-10-CM | POA: Diagnosis not present

## 2015-10-20 DIAGNOSIS — N854 Malposition of uterus: Secondary | ICD-10-CM | POA: Insufficient documentation

## 2015-10-20 DIAGNOSIS — R109 Unspecified abdominal pain: Secondary | ICD-10-CM

## 2015-10-20 HISTORY — DX: Benign neoplasm of connective and other soft tissue, unspecified: D21.9

## 2015-10-20 LAB — CBC WITH DIFFERENTIAL/PLATELET
BASOS ABS: 0 10*3/uL (ref 0.0–0.1)
BASOS PCT: 1 %
Eosinophils Absolute: 0.1 10*3/uL (ref 0.0–0.7)
Eosinophils Relative: 4 %
HEMATOCRIT: 36.9 % (ref 36.0–46.0)
HEMOGLOBIN: 12.7 g/dL (ref 12.0–15.0)
LYMPHS PCT: 49 %
Lymphs Abs: 1.8 10*3/uL (ref 0.7–4.0)
MCH: 28.5 pg (ref 26.0–34.0)
MCHC: 34.4 g/dL (ref 30.0–36.0)
MCV: 82.9 fL (ref 78.0–100.0)
MONO ABS: 0.2 10*3/uL (ref 0.1–1.0)
Monocytes Relative: 4 %
NEUTROS ABS: 1.5 10*3/uL — AB (ref 1.7–7.7)
NEUTROS PCT: 42 %
Platelets: 249 10*3/uL (ref 150–400)
RBC: 4.45 MIL/uL (ref 3.87–5.11)
RDW: 12.9 % (ref 11.5–15.5)
WBC: 3.6 10*3/uL — ABNORMAL LOW (ref 4.0–10.5)

## 2015-10-20 LAB — URINALYSIS, ROUTINE W REFLEX MICROSCOPIC
Bilirubin Urine: NEGATIVE
Glucose, UA: NEGATIVE mg/dL
Ketones, ur: NEGATIVE mg/dL
Nitrite: NEGATIVE
PROTEIN: NEGATIVE mg/dL
SPECIFIC GRAVITY, URINE: 1.025 (ref 1.005–1.030)
pH: 5.5 (ref 5.0–8.0)

## 2015-10-20 LAB — COMPREHENSIVE METABOLIC PANEL
ALBUMIN: 4.5 g/dL (ref 3.5–5.0)
ALK PHOS: 34 U/L — AB (ref 38–126)
ALT: 13 U/L — AB (ref 14–54)
AST: 17 U/L (ref 15–41)
Anion gap: 8 (ref 5–15)
BILIRUBIN TOTAL: 0.7 mg/dL (ref 0.3–1.2)
BUN: 15 mg/dL (ref 6–20)
CALCIUM: 9.3 mg/dL (ref 8.9–10.3)
CO2: 23 mmol/L (ref 22–32)
CREATININE: 0.68 mg/dL (ref 0.44–1.00)
Chloride: 106 mmol/L (ref 101–111)
GFR calc Af Amer: >60 mL/min (ref >60)
GLUCOSE: 100 mg/dL — AB (ref 65–99)
POTASSIUM: 4 mmol/L (ref 3.5–5.1)
Sodium: 137 mmol/L (ref 135–145)
Total Protein: 7.7 g/dL (ref 6.5–8.1)

## 2015-10-20 LAB — POCT PREGNANCY, URINE: PREG TEST UR: NEGATIVE

## 2015-10-20 LAB — URINE MICROSCOPIC-ADD ON: RBC / HPF: NONE SEEN RBC/hpf (ref 0–5)

## 2015-10-20 MED ORDER — KETOROLAC TROMETHAMINE 60 MG/2ML IM SOLN
60.0000 mg | Freq: Once | INTRAMUSCULAR | Status: AC
  Administered 2015-10-20: 60 mg via INTRAMUSCULAR
  Filled 2015-10-20: qty 2

## 2015-10-20 NOTE — MAU Note (Signed)
Pt complaining of intense lower abd pain.  Woke her around 0500.  Was able to lay back down and dozed off.  Woke her again.  Denies any GI or GU complaints.  Never had pain like this before.  Has a Mirena, placed 3 yrs ago.

## 2015-10-20 NOTE — MAU Note (Signed)
Dr. Garwin Brothers in to discuss results and D/C instrs. Patient to F/U with primary care MD.

## 2015-10-20 NOTE — Discharge Instructions (Signed)
F/U with your primary care MD today or as needed for recurrence of pain. F/U with Dr. Dellis Filbert for further concerns or routine GYN care.

## 2015-10-20 NOTE — MAU Note (Signed)
Dr. Garwin Brothers had requested cath U/A if urine + for blood. Patient aware, but requests to wait until results received from U/S to see if cath U/A really necessary.

## 2015-10-20 NOTE — MAU Provider Note (Signed)
History     CSN: [id number]  Arrival date and time: 10/20/15 [id number]   First Provider Initiated Contact with Patient 10/20/15 [phone]      Chief Complaint  Patient presents with  . Abdominal Pain   HPI 30 yo [id number] BF presents with acute onset of lower abdominal pain since 4 am. Pain is sharp, intermittent nonradiating. No assoc nausea/vomiting/ fever. BM nl. Mirena IUD x 3 yrs. Not sexually active for a year (+) dark brown vaginal d/c . (cycle).  Denies urinary frequency, dysuria . ? If kidney stone but no hx  OB History    Gravida Para Term Preterm AB TAB SAB Ectopic Multiple Living   2    2 2     0      Past Medical History  Diagnosis Date  . Asthma   . Migraine   . Fibroid     History reviewed. No pertinent past surgical history.  Family History  Problem Relation Age of Onset  . Hypertension      Social History  Substance Use Topics  . Smoking status: Former Smoker    Quit date: 02/05/2014  . Smokeless tobacco: Never Used  . Alcohol Use: 0.0 oz/week    0 Standard drinks or equivalent per week     Comment: Social    Allergies:  Allergies  Allergen Reactions  . Bactrim [Sulfamethoxazole-Trimethoprim] Anaphylaxis    Hypotension and Fever-like symptoms  . Sulfur Anaphylaxis    Low heart rate    Prescriptions prior to admission  Medication Sig Dispense Refill Last Dose  . albuterol (PROVENTIL HFA;VENTOLIN HFA) 108 (90 BASE) MCG/ACT inhaler Inhale 2 puffs into the lungs every 4 (four) hours as needed for wheezing or shortness of breath. 1 Inhaler 5 Taking  . albuterol (PROVENTIL) (2.5 MG/3ML) 0.083% nebulizer solution Take 3 mLs (2.5 mg total) by nebulization every 4 (four) hours as needed for wheezing. 75 mL 12 Taking  . escitalopram (LEXAPRO) 10 MG tablet Take 1 tablet (10 mg total) by mouth daily. 60 tablet 0   . furosemide (LASIX) 20 MG tablet Take 2 tablets (40 mg total) by mouth daily. 2 tablet 0     Review of Systems  Gastrointestinal: Positive for  abdominal pain.  Genitourinary: Negative for dysuria, frequency and flank pain.   Physical Exam   Blood pressure 119/66, pulse 84, temperature 98.5 F (36.9 C), temperature source Oral, resp. rate 20, height 5' 5.5" (1.664 m), weight 74.753 kg (164 lb 12.8 oz).  Physical Exam  Constitutional: She is oriented to person, place, and time. She appears well-developed and well-nourished. She appears distressed.  tearful  HENT:  Head: Atraumatic.  Eyes: EOM are normal.  Neck: Neck supple.  Cardiovascular: Regular rhythm.   Respiratory: Breath sounds normal.  GI: Soft.  Genitourinary:  Vulva nl Vagina. Dark blood in vault Cervix closed no IUD string seen Uterus irreg sl enlarged Bladder nontender Adnexa: no palp mass  Neurological: She is alert and oriented to person, place, and time.  Skin: Skin is warm and dry.  Psychiatric: She has a normal mood and affect.  Abdomen: soft flat active BS tender to palp suprapubic . MAU Course  Procedures  MDM   Assessment and Plan  Lower abdominal pain Lost Mirena IUD string P) toradol IM. U/a. Pelvic sonogram. Cbc w/ diff, CMP  Lawyer Washabaugh A 10/20/2015, 10:00 AM    Addendum: Urine dipstick shows positive for large hgb.  Micro exam: 0 RBC's per HPF. ucx sent  CBC  Component Value Date/Time   WBC 3.6* 10/20/2015 0935   RBC 4.45 10/20/2015 0935   HGB 12.7 10/20/2015 0935   HCT 36.9 10/20/2015 0935   PLT 249 10/20/2015 0935   MCV 82.9 10/20/2015 0935   MCH 28.5 10/20/2015 0935   MCHC 34.4 10/20/2015 0935   RDW 12.9 10/20/2015 0935   LYMPHSABS 1.8 10/20/2015 0935   MONOABS 0.2 10/20/2015 0935   EOSABS 0.1 10/20/2015 0935   BASOSABS 0.0 10/20/2015 0935   CMP     Component Value Date/Time   NA 137 10/20/2015 0935   K 4.0 10/20/2015 0935   CL 106 10/20/2015 0935   CO2 23 10/20/2015 0935   GLUCOSE 100* 10/20/2015 0935   BUN 15 10/20/2015 0935   CREATININE 0.68 10/20/2015 0935   CALCIUM 9.3 10/20/2015 0935   PROT  7.7 10/20/2015 0935   ALBUMIN 4.5 10/20/2015 0935   AST 17 10/20/2015 0935   ALT 13* 10/20/2015 0935   ALKPHOS 34* 10/20/2015 0935   BILITOT 0.7 10/20/2015 0935   GFRNONAA >60 10/20/2015 0935   GFRAA >60 10/20/2015 0935   US Transvaginal Non-ob  10/20/2015  CLINICAL DATA:  30 year old female with severe lower abdominal pain since this morning. Mirena IUD placed 3 years prior. LMP 10/18/2015. EXAM: TRANSABDOMINAL AND TRANSVAGINAL ULTRASOUND OF PELVIS TECHNIQUE: Both transabdominal and transvaginal ultrasound examinations of the pelvis were performed. Transabdominal technique was performed for global imaging of the pelvis including uterus, ovaries, adnexal regions, and pelvic cul-de-sac. It was necessary to proceed with endovaginal exam following the transabdominal exam to visualize the endometrium and adnexa. COMPARISON:  01/13/2015 pelvic sonogram. FINDINGS: Uterus Measurements: 8.1 x 4.3 x 5.9 cm. The anteverted uterus is mildly enlarged by fibroids as follows: - right posterior fundal subserosal 2.6 x 2.4 x 2.6 cm fibroid, not previously described - left posterior uterine body intramural 2.6 x 2.4 x 2.7 cm fibroid, previously 2.0 x 1.9 x 1.9 cm, mildly increased in size - anterior fundal exophytic subserosal 1.8 x 1.1 x 1.8 cm fibroid, not previously described Endometrium Thickness: 3 mm. Intrauterine device appears well positioned within the fundal endometrial cavity, with no evidence of myometrial penetration by the side arms of the device. No endometrial cavity fluid or focal endometrial mass demonstrated. Right ovary Measurements: 3.7 x 2.5 x 2.3 cm. Normal appearance/no adnexal mass. Left ovary Measurements: 3.1 x 1.7 x 1.8 cm. Normal appearance/no adnexal mass. Other findings No abnormal free fluid. IMPRESSION: 1. Mildly enlarged myomatous uterus. 2. IUD is well positioned within the endometrial cavity, with no evidence of an IUD complication . 3. No endometrial abnormality. 4. Normal ovaries.  No  adnexal abnormality. Electronically Signed   By: Ilona Sorrel M.D.   On: 10/20/2015 10:23   US Pelvis Complete  10/20/2015  CLINICAL DATA:  30 year old female with severe lower abdominal pain since this morning. Mirena IUD placed 3 years prior. LMP 10/18/2015. EXAM: TRANSABDOMINAL AND TRANSVAGINAL ULTRASOUND OF PELVIS TECHNIQUE: Both transabdominal and transvaginal ultrasound examinations of the pelvis were performed. Transabdominal technique was performed for global imaging of the pelvis including uterus, ovaries, adnexal regions, and pelvic cul-de-sac. It was necessary to proceed with endovaginal exam following the transabdominal exam to visualize the endometrium and adnexa. COMPARISON:  01/13/2015 pelvic sonogram. FINDINGS: Uterus Measurements: 8.1 x 4.3 x 5.9 cm. The anteverted uterus is mildly enlarged by fibroids as follows: - right posterior fundal subserosal 2.6 x 2.4 x 2.6 cm fibroid, not previously described - left posterior uterine body intramural 2.6 x 2.4 x  2.7 cm fibroid, previously 2.0 x 1.9 x 1.9 cm, mildly increased in size - anterior fundal exophytic subserosal 1.8 x 1.1 x 1.8 cm fibroid, not previously described Endometrium Thickness: 3 mm. Intrauterine device appears well positioned within the fundal endometrial cavity, with no evidence of myometrial penetration by the side arms of the device. No endometrial cavity fluid or focal endometrial mass demonstrated. Right ovary Measurements: 3.7 x 2.5 x 2.3 cm. Normal appearance/no adnexal mass. Left ovary Measurements: 3.1 x 1.7 x 1.8 cm. Normal appearance/no adnexal mass. Other findings No abnormal free fluid. IMPRESSION: 1. Mildly enlarged myomatous uterus. 2. IUD is well positioned within the endometrial cavity, with no evidence of an IUD complication . 3. No endometrial abnormality. 4. Normal ovaries.  No adnexal abnormality. Electronically Signed   By: Ilona Sorrel M.D.   On: 10/20/2015 10:23  IMP: Nongyn related abdominal pain Fibroid  uterus IUD in place Disc need for u/a to determine if the large hgb related to her cycle. Pt declines Advised CT scan to r/o stone would not be done w/o indication( u/a) Pain relieved with Toradol. ucx sent D/c home  F/u with PCP

## 2015-11-18 ENCOUNTER — Other Ambulatory Visit: Payer: Self-pay | Admitting: Internal Medicine

## 2016-08-08 IMAGING — CT CT ANGIO CHEST
2 of 6 series · 18 of 36 positions shown · IV contrast (OMNIPAQUE 350)
Comparison: Chest x-ray same date.

CLINICAL DATA: Chest pain, dyspnea, pleural effusion and shortness
of breath since last evening.

EXAM:
CT ANGIOGRAPHY CHEST WITH CONTRAST
TECHNIQUE: Multidetector CT imaging of the chest was performed using the
standard protocol during bolus administration of intravenous
contrast. Multiplanar CT image reconstructions and MIPs were
obtained to evaluate the vascular anatomy.
CONTRAST:  100mL OMNIPAQUE IOHEXOL 350 MG/ML SOLN

[Series 6: thins for pacs · axial · 0.60mm/px · z∈[-201,+20]mm · 17 of 247 slices shown]
[im 13/247  lung]
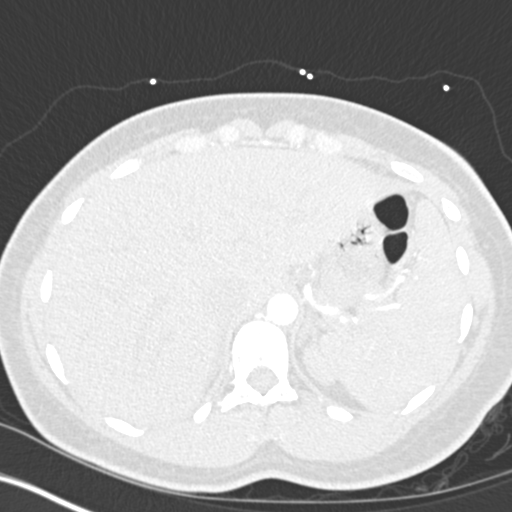
[im 25/247  mediastinal]
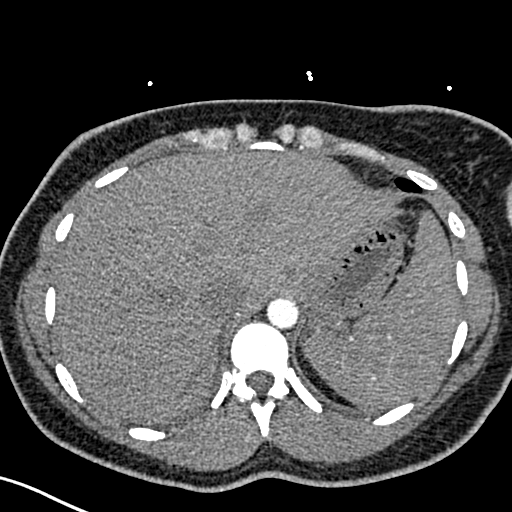
[im 37/247  lung]
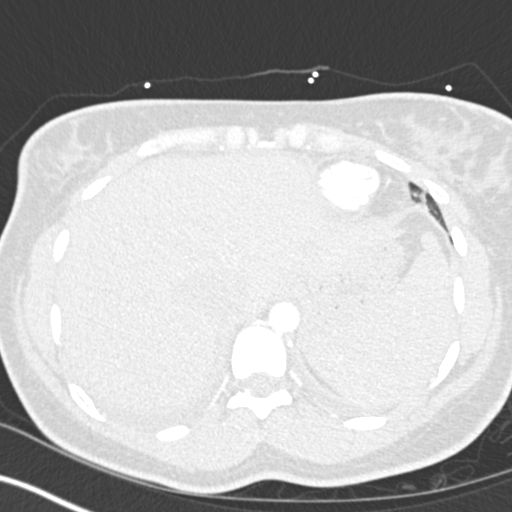
[im 50/247  mediastinal]
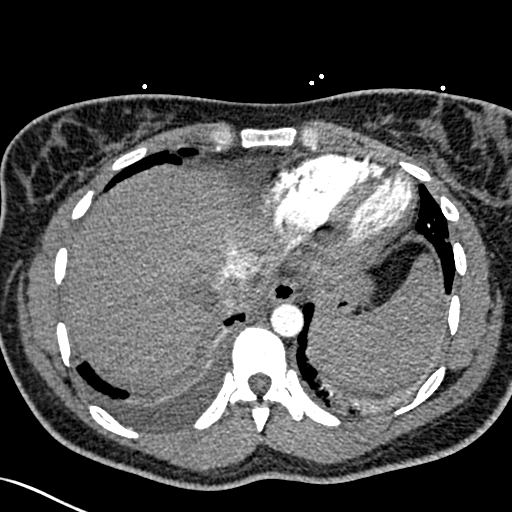
[im 74/247  lung]
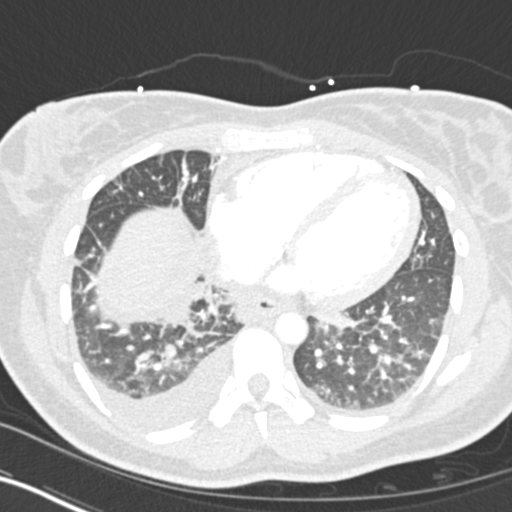
[im 87/247  mediastinal]
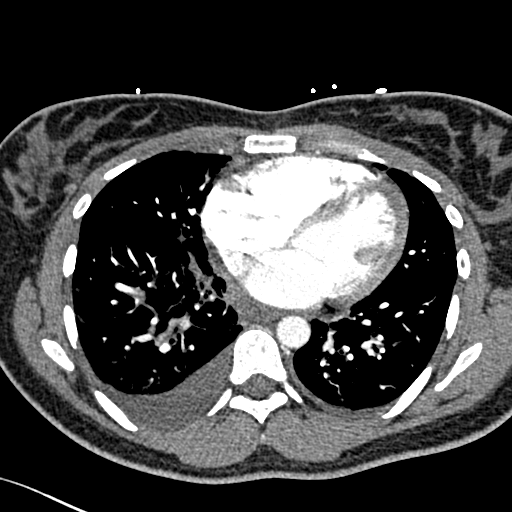
[im 99/247  lung]
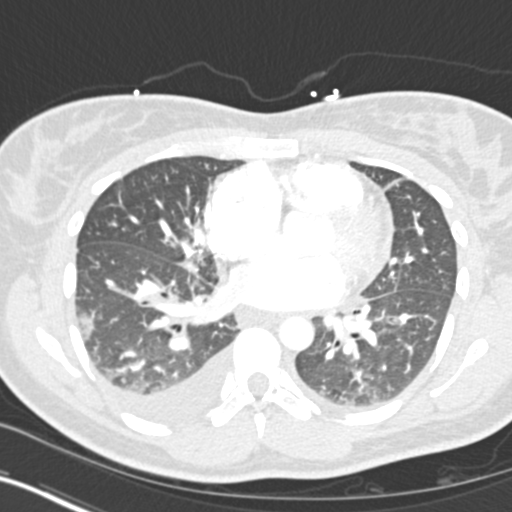
[im 111/247  mediastinal]
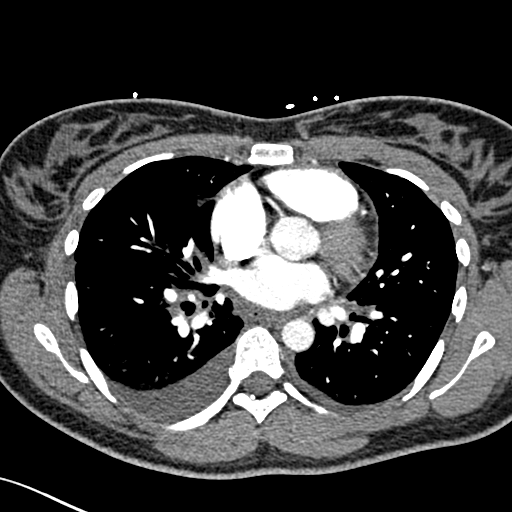
[im 124/247  lung]
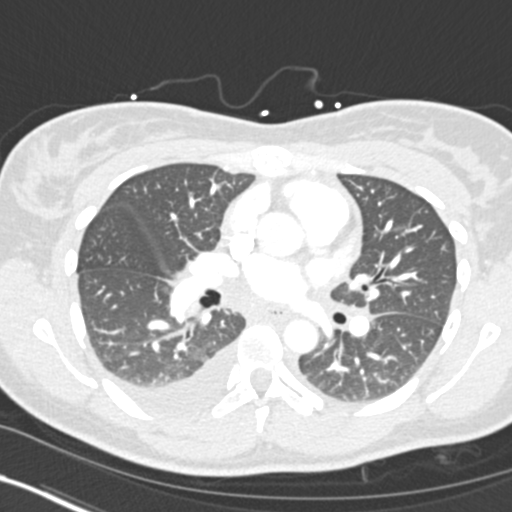
[im 136/247  mediastinal]
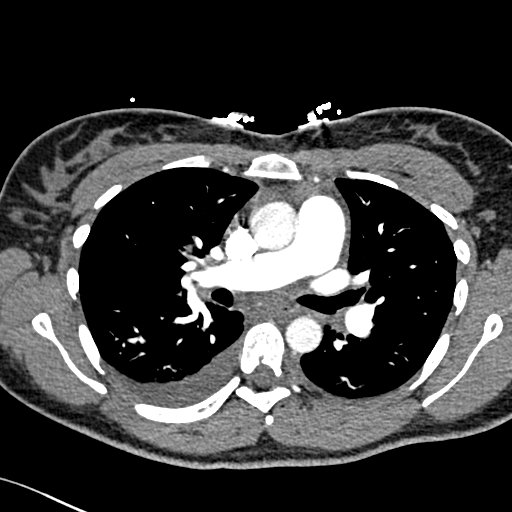
[im 148/247  lung]
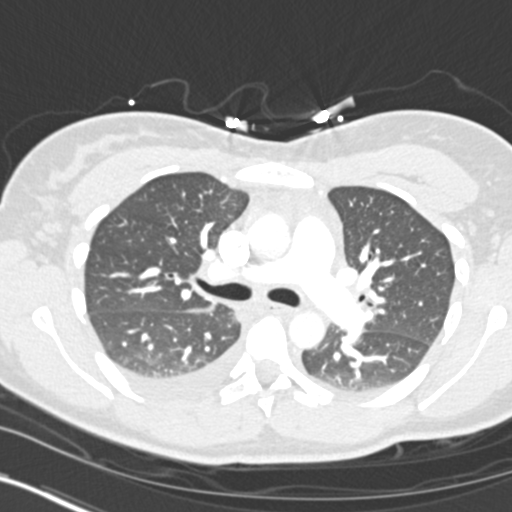
[im 160/247  mediastinal]
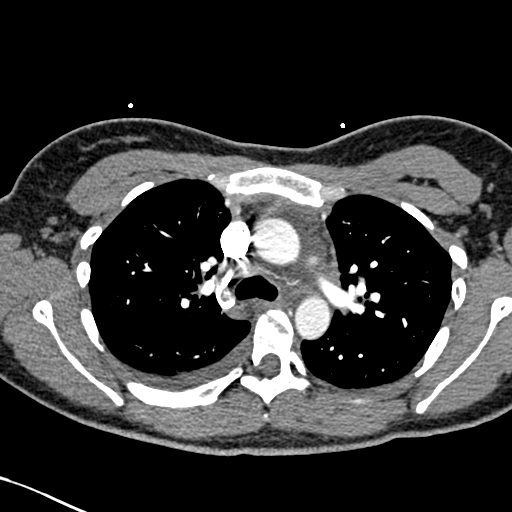
[im 173/247  lung]
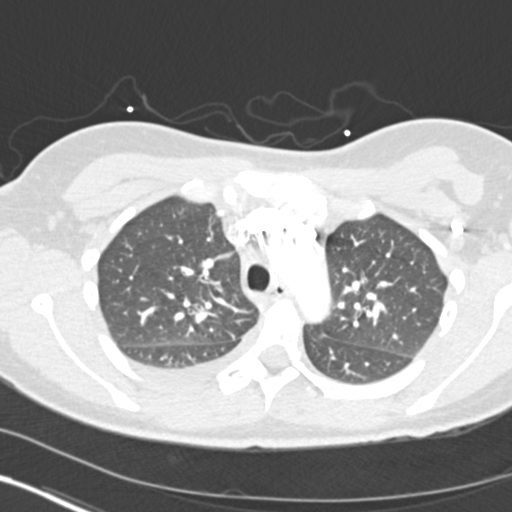
[im 197/247  mediastinal]
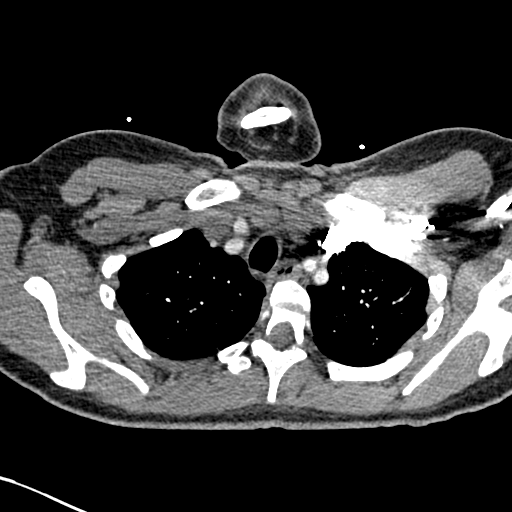
[im 210/247  lung]
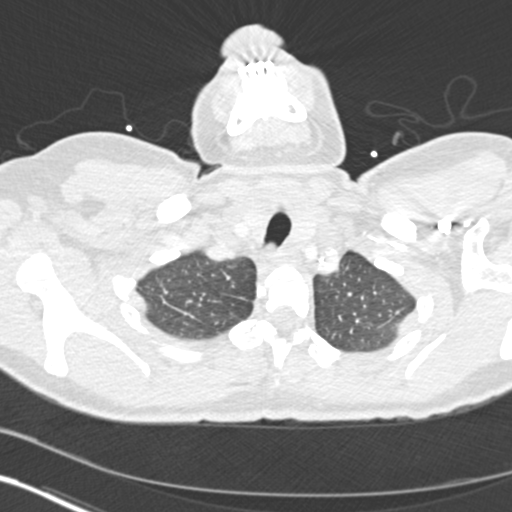
[im 222/247  mediastinal]
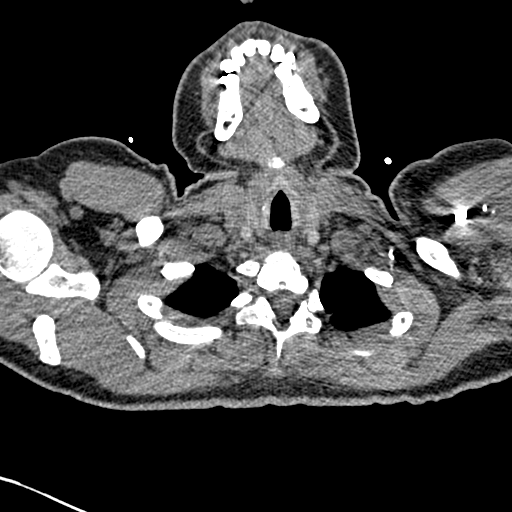
[im 234/247  lung]
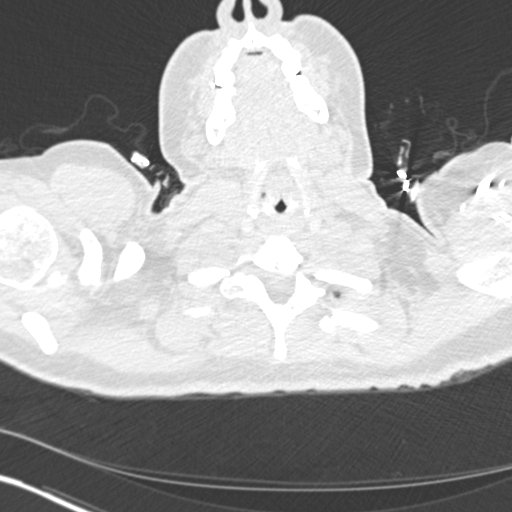

[Series 8: coronal mpr · coronal · 0.48mm/px · 1 of 121 slices shown]
[im 61/121  mediastinal]
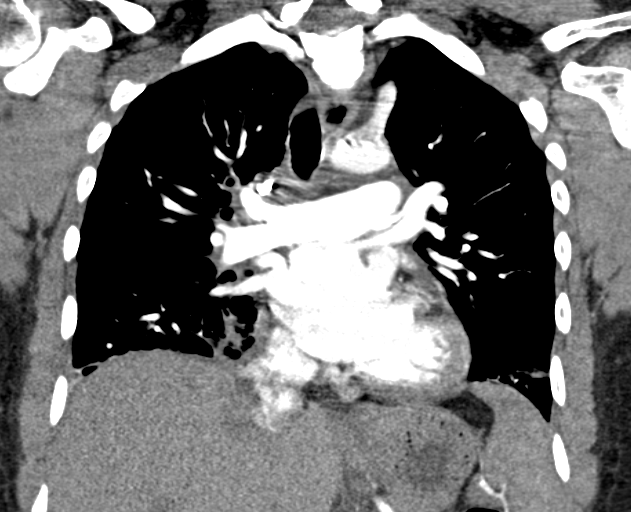

[18 of 36 positions shown; findings below may reference images not displayed]

FINDINGS: Mediastinum/Nodes: No breast masses, supraclavicular or axillary
lymphadenopathy. Small scattered lymph nodes are noted. The thyroid
gland is grossly normal.

The heart is normal in size. No pericardial effusion. Fluid noted in
the pericardial recesses. The aorta is normal in caliber. No
dissection. The branch vessels are patent. Borderline mediastinal
and hilar lymph nodes are most likely inflammatory or hyperplastic.

Fairly marked mid distal esophageal wall thickening versus
periesophageal inflammation/fluid.

The pulmonary arterial tree is well opacified. No filling defects to
suggest pulmonary embolism.

Lungs/Pleura: There is a small to moderate-sized right pleural
effusion and a small left pleural effusion. Patchy airspace
opacities and both lower lobes suspicious for infiltrates. Similar
findings in the right middle lobe appear more like atelectasis. No
worrisome pulmonary lesions. No pulmonary edema.

Upper abdomen: No significant findings.

Musculoskeletal: No significant bony findings.

Review of the MIP images confirms the above findings.
IMPRESSION: 1. No CT findings for pulmonary embolism.
2. Normal thoracic aorta.
3. Bilateral pleural effusions, right greater than left with patchy
bilateral infiltrates and atelectasis.
4. Borderline enlarged mediastinal and hilar lymph nodes are likely
inflammatory/hyperplastic. There is also either fluid/edema around
the esophagus or involving the esophageal wall. Recommend
correlation with clinical symptoms of dysphagia/ reflux esophagitis.

## 2016-08-09 IMAGING — CR DG CHEST 2V
2 series · 2 of 2 positions shown · non-contrast
Comparison: None.

CLINICAL DATA: Mid chest pressure with shortness of breath. Also
with headaches since yesterday. 01/15/2015

EXAM:
CHEST  2 VIEW

[w chest pa]
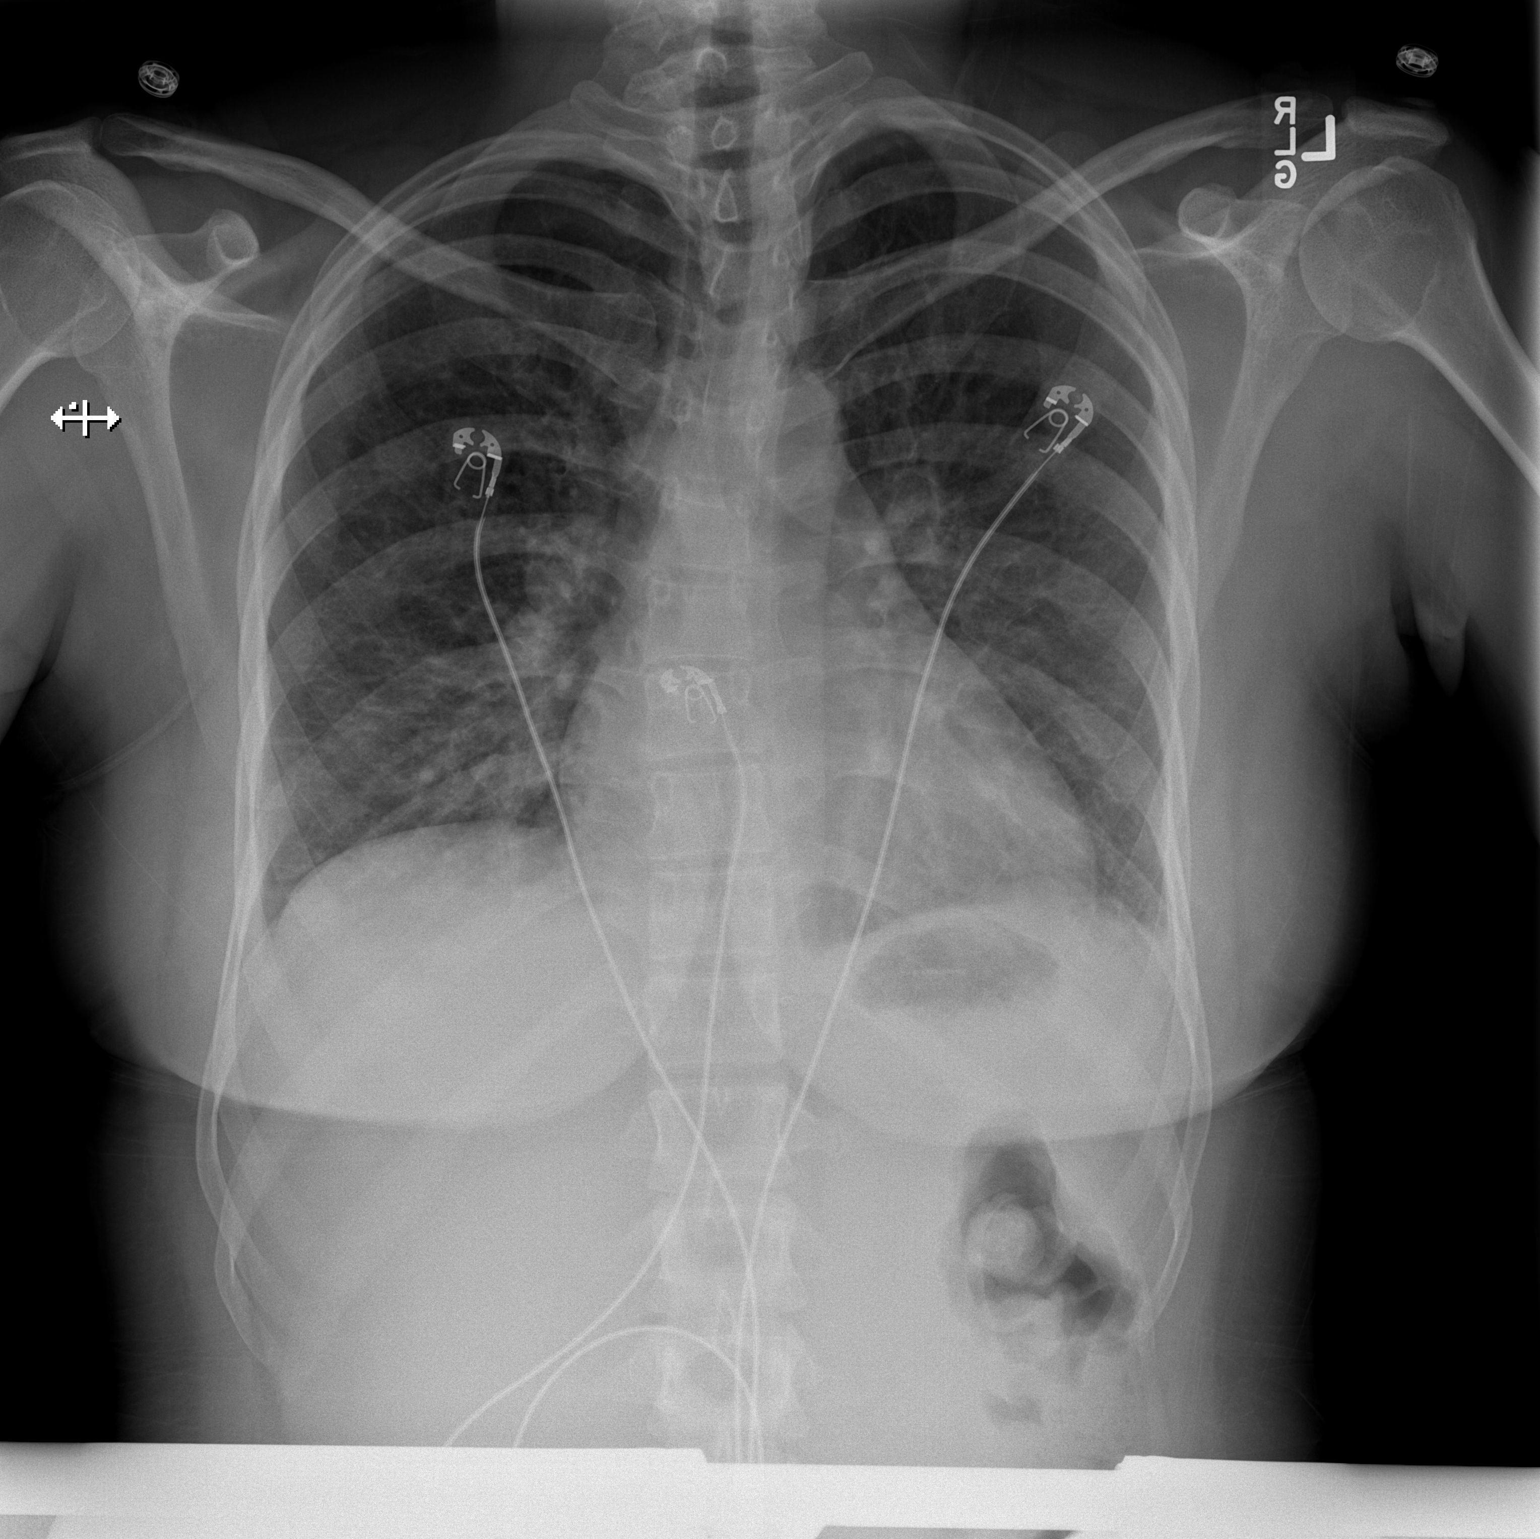

[w chest lat]
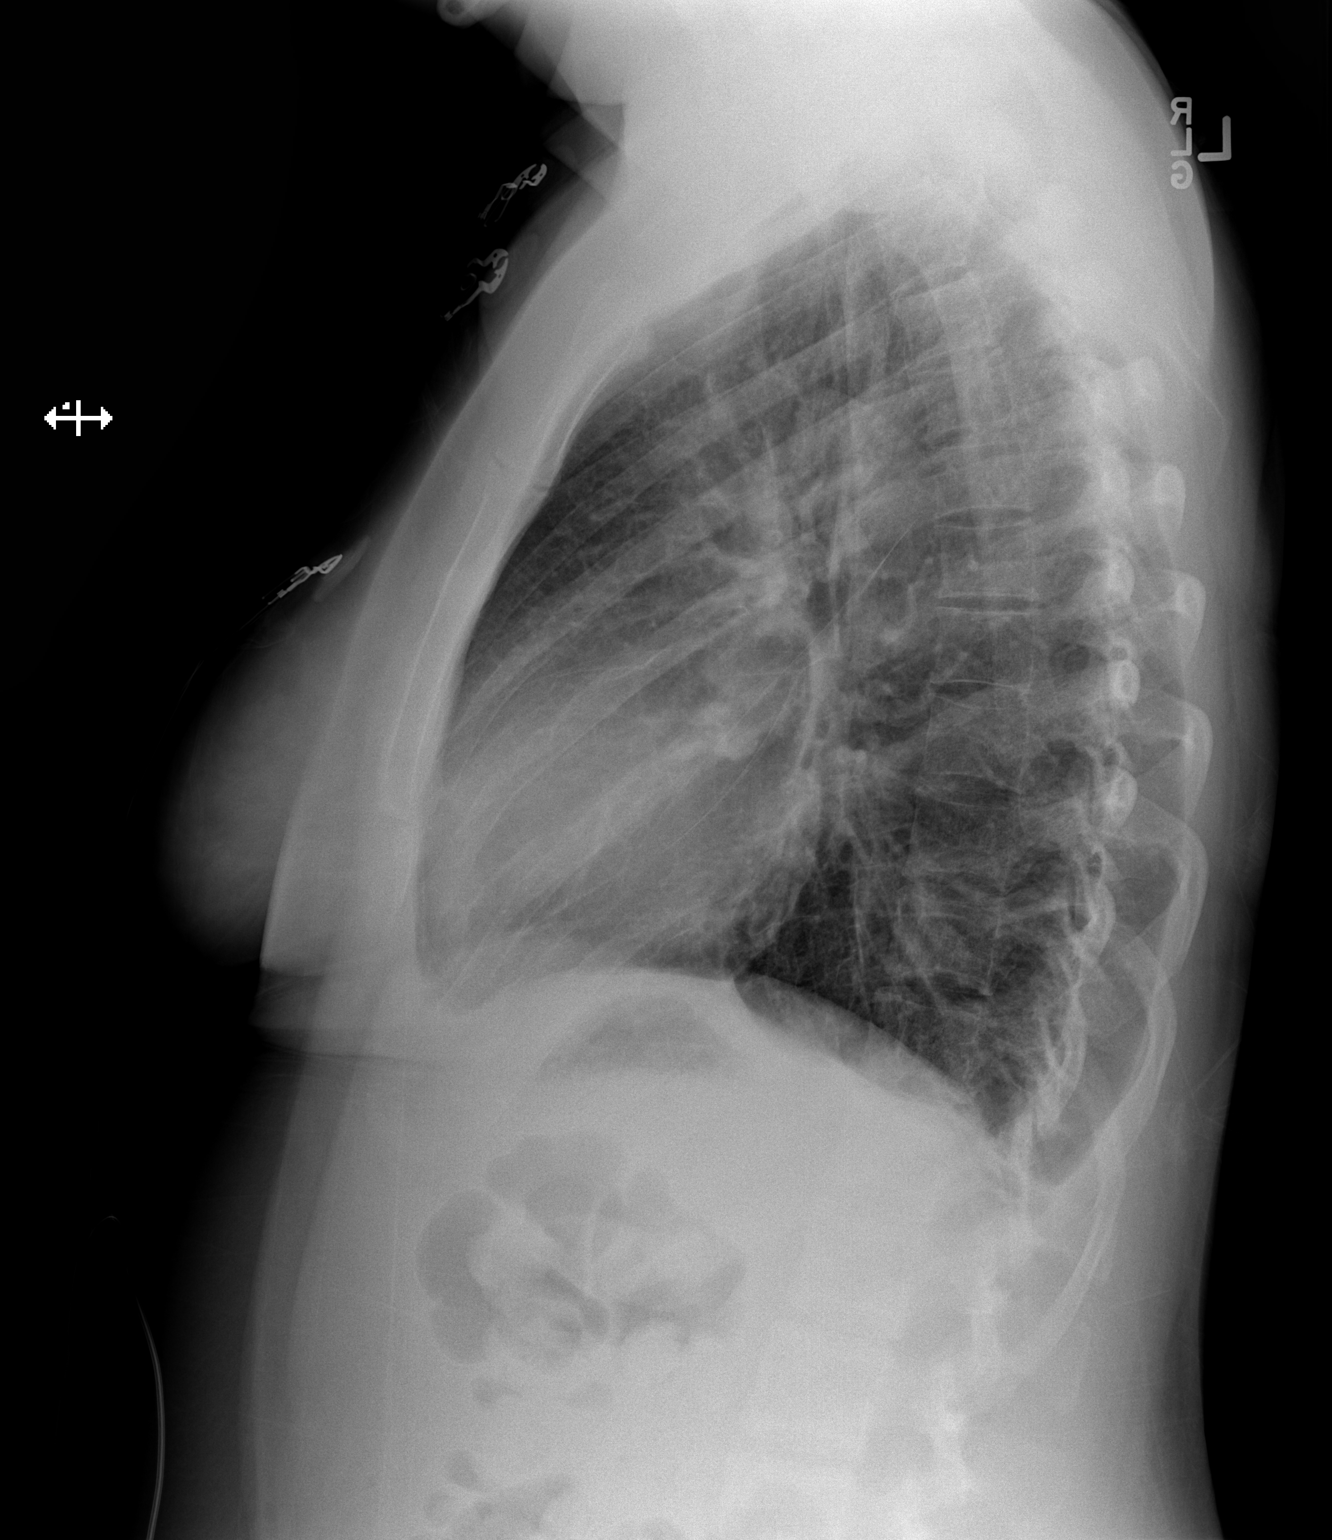

[2 of 2 positions shown; findings below may reference images not displayed]

FINDINGS: Cardiac silhouette is borderline enlarged. Normal mediastinal and
hilar contours.

Irregular interstitial thickening is noted most evident in the lung
bases, right greater than left. Minimal right pleural effusion. No
focal lung consolidation seen. There is no pneumothorax.

Skeletal structures are unremarkable.
IMPRESSION: 1. Interstitial thickening is again noted most evident in the lower
lungs. This has mildly improved from the previous day's study.
2. Minimal right effusion and borderline cardiomegaly are stable.
Are no focal areas of lung consolidation to suggest lobar pneumonia.
3. Findings may reflect pulmonary edema. Diffuse lower lung zone
interstitial infection is possible.

## 2016-12-29 ENCOUNTER — Encounter: Payer: Self-pay | Admitting: Internal Medicine

## 2017-10-16 ENCOUNTER — Encounter: Payer: Self-pay | Admitting: Family Medicine

## 2017-10-16 ENCOUNTER — Ambulatory Visit: Payer: 59 | Admitting: Family Medicine

## 2017-10-16 VITALS — BP 105/60 | HR 60 | Temp 98.5°F | Resp 16 | Wt 172.4 lb

## 2017-10-16 DIAGNOSIS — Z0000 Encounter for general adult medical examination without abnormal findings: Secondary | ICD-10-CM

## 2017-10-16 DIAGNOSIS — E559 Vitamin D deficiency, unspecified: Secondary | ICD-10-CM

## 2017-10-16 DIAGNOSIS — Z111 Encounter for screening for respiratory tuberculosis: Secondary | ICD-10-CM

## 2017-10-16 NOTE — Patient Instructions (Addendum)
Keep up the good work.   1-2 days to get the results of your labs back.   Let us know if you change your mind about the pneumonia vaccine.  Let us know if you need anything.

## 2017-10-16 NOTE — Addendum Note (Signed)
Addended by: Harl Bowie on: 10/16/2017 03:50 PM   Modules accepted: Orders

## 2017-10-16 NOTE — Progress Notes (Signed)
CC: Transfer of care  Well Woman Diana Mccormick is here for a complete physical.   Current diet: in general, a "healthy" diet. Current exercise: kickboxing. No LMP recorded. (Menstrual status: IUD). Seatbelt? Yes  Health Maintenance Pap/HPV- Yes Tetanus- Yes HIV screening- Yes  Past Medical History:  Diagnosis Date  . Asthma   . Fibroid   . Migraine     History reviewed. No pertinent surgical history.  Medications  Current Outpatient Medications on File Prior to Visit  Medication Sig Dispense Refill  . albuterol (PROVENTIL) (2.5 MG/3ML) 0.083% nebulizer solution Take 3 mLs (2.5 mg total) by nebulization every 4 (four) hours as needed for wheezing. 75 mL 12  . naproxen sodium (ANAPROX) 220 MG tablet Take 440 mg by mouth 2 (two) times daily as needed (pain).    . VENTOLIN HFA 108 (90 Base) MCG/ACT inhaler INHALE 2 PUFFS INTO THE LUNGS EVERY 4 HOURS AS NEEDED FOR WHEEZING OR SHORTNESS OF BREATH. 18 g 5   Allergies Allergies  Allergen Reactions  . Bactrim [Sulfamethoxazole-Trimethoprim] Anaphylaxis    Hypotension and Fever-like symptoms  . Sulfur Anaphylaxis    Low heart rate   Review of Systems: Constitutional:  no unexpected weight changes Eye:  no recent significant change in vision Ear/Nose/Mouth/Throat:  Ears:  no tinnitus or vertigo and no recent change in hearing Nose/Mouth/Throat:  no complaints of nasal congestion, no sore throat Cardiovascular: no chest pain Respiratory:  no cough and no shortness of breath Gastrointestinal:  no abdominal pain, no change in bowel habits GU:  Female: negative for dysuria or pelvic pain Musculoskeletal/Extremities:  no pain of the joints Integumentary (Skin/Breast):  no abnormal skin lesions reported Neurologic:  no headaches Endocrine:  denies fatigue Hematologic/Lymphatic:  No areas of easy bleeding  Exam BP 105/60 (BP Location: Left Arm, Patient Position: Sitting, Cuff Size: Normal)   Pulse 60   Temp 98.5 F (36.9 C)  (Oral)   Resp 16   Wt 172 lb 6.4 oz (78.2 kg)   SpO2 100%   BMI 28.25 kg/m  General:  well developed, well nourished, in no apparent distress Skin:  no significant moles, warts, or growths Head:  no masses, lesions, or tenderness Eyes:  pupils equal and round, sclera anicteric without injection Ears:  canals without lesions, TMs shiny without retraction, no obvious effusion, no erythema Nose:  nares patent, septum midline, mucosa normal, and no drainage or sinus tenderness Throat/Pharynx:  lips and gingiva without lesion; tongue and uvula midline; non-inflamed pharynx; no exudates or postnasal drainage Neck: neck supple without adenopathy, thyromegaly, or masses Lungs:  clear to auscultation, breath sounds equal bilaterally, no respiratory distress Cardio:  regular rate and rhythm, no bruits, no LE edema Abdomen:  abdomen soft, nontender; bowel sounds normal; no masses or organomegaly Genital: Defer to GYN Musculoskeletal:  symmetrical muscle groups noted without atrophy or deformity Extremities:  no clubbing, cyanosis, or edema, no deformities, no skin discoloration Neuro:  gait normal; deep tendon reflexes normal and symmetric Psych: well oriented with normal range of affect and appropriate judgment/insight  Assessment and Plan  Well adult exam - Plan: TSH, Lipid panel, CBC, Comprehensive metabolic panel  Vitamin D insufficiency - Plan: Vitamin D (25 hydroxy)   Well 32 y.o. female. Counseled on diet and exercise. Form filled out for school. Other orders as above. Follow up in 1 yr or prn. The patient voiced understanding and agreement to the plan.  [place], DO 10/16/17 3:39 PM

## 2017-10-17 LAB — CBC
HCT: 37.7 % (ref 36.0–46.0)
Hemoglobin: 12.8 g/dL (ref 12.0–15.0)
MCHC: 33.9 g/dL (ref 30.0–36.0)
MCV: 88.3 fl (ref 78.0–100.0)
PLATELETS: 225 10*3/uL (ref 150.0–400.0)
RBC: 4.27 Mil/uL (ref 3.87–5.11)
RDW: 13.9 % (ref 11.5–15.5)
WBC: 5.2 10*3/uL (ref 4.0–10.5)

## 2017-10-17 LAB — LIPID PANEL
CHOL/HDL RATIO: 2
Cholesterol: 197 mg/dL (ref 0–200)
HDL: 84.9 mg/dL (ref >39.00)
LDL CALC: 96 mg/dL (ref 0–99)
NONHDL: 112.04
Triglycerides: 80 mg/dL (ref 0.0–149.0)
VLDL: 16 mg/dL (ref 0.0–40.0)

## 2017-10-17 LAB — COMPREHENSIVE METABOLIC PANEL
ALT: 13 U/L (ref 0–35)
AST: 17 U/L (ref 0–37)
Albumin: 4.4 g/dL (ref 3.5–5.2)
Alkaline Phosphatase: 29 U/L — ABNORMAL LOW (ref 39–117)
BILIRUBIN TOTAL: 0.4 mg/dL (ref 0.2–1.2)
BUN: 14 mg/dL (ref 6–23)
CHLORIDE: 101 meq/L (ref 96–112)
CO2: 26 meq/L (ref 19–32)
CREATININE: 0.73 mg/dL (ref 0.40–1.20)
Calcium: 9.5 mg/dL (ref 8.4–10.5)
GFR: 118.51 mL/min (ref >60.00)
GLUCOSE: 90 mg/dL (ref 70–99)
Potassium: 3.9 mEq/L (ref 3.5–5.1)
SODIUM: 136 meq/L (ref 135–145)
Total Protein: 7.4 g/dL (ref 6.0–8.3)

## 2017-10-17 LAB — VITAMIN D 25 HYDROXY (VIT D DEFICIENCY, FRACTURES): VITD: 29.24 ng/mL — ABNORMAL LOW (ref 30.00–100.00)

## 2017-10-17 LAB — TSH: TSH: 1.74 u[IU]/mL (ref 0.35–4.50)

## 2017-10-18 LAB — QUANTIFERON-TB GOLD PLUS
Mitogen-NIL: >10 IU/mL
NIL: 0.04 IU/mL
QuantiFERON-TB Gold Plus: NEGATIVE
TB1-NIL: 0.06 IU/mL
TB2-NIL: 0.04 IU/mL

## 2017-10-24 ENCOUNTER — Encounter: Payer: Self-pay | Admitting: Obstetrics & Gynecology

## 2017-10-30 ENCOUNTER — Ambulatory Visit: Payer: 59 | Admitting: Obstetrics & Gynecology

## 2017-10-30 ENCOUNTER — Ambulatory Visit: Payer: 59 | Admitting: Family Medicine

## 2017-10-30 ENCOUNTER — Encounter: Payer: Self-pay | Admitting: Obstetrics & Gynecology

## 2017-10-30 VITALS — BP 130/80 | Ht 66.0 in | Wt 170.0 lb

## 2017-10-30 DIAGNOSIS — T8332XA Displacement of intrauterine contraceptive device, initial encounter: Secondary | ICD-10-CM | POA: Diagnosis not present

## 2017-10-30 NOTE — Patient Instructions (Addendum)
1. Intrauterine contraceptive device threads lost, initial encounter Mirena IUD inserted in 2015.  IUD strings not visible.  Normal gynecologic exam otherwise and no pelvic pain.  Patient will follow-up for pelvic ultrasound to locate the IUD.  Recommend condom use in the meantime. - US Transvaginal Non-OB; Future  [place], it was a pleasure seeing you today!

## 2017-10-30 NOTE — Progress Notes (Signed)
    Diana Mccormick 01/17/1986 [id number]        32 y.o.  G2P0020 Stable boyfriend, getting married.  Nurse/Starting Midwifery studies.  Moved back from [place].  RP: IUD check  HPI: Light normal menses every month.  Mirena IUD inserted in 2015.  No pelvic pain.  Normal vaginal secretions.  Will probably attempt conception in 2020.  Annual/Gyn exam/Pap normal per patient in 2018.   OB History  Gravida Para Term Preterm AB Living  2       2 0  SAB TAB Ectopic Multiple Live Births    2          # Outcome Date GA Lbr Len/2nd Weight Sex Delivery Anes PTL Lv  2 TAB           1 TAB             Past medical history,surgical history, problem list, medications, allergies, family history and social history were all reviewed and documented in the EPIC chart.   Directed ROS with pertinent positives and negatives documented in the history of present illness/assessment and plan.  Exam:  Vitals:   10/30/17 1238  BP: 130/80  Weight: 170 lb (77.1 kg)  Height: 5\' 6"  (1.676 m)   General appearance:  Normal  Abdomen: Normal  Gynecologic exam: Vulva normal.  Speculum:  Cervix normal, but strings not visible. Vagina normal.  Bimanual exam:  Strings not felt.  Uterus AV, normal volume, NT.  No adnexal mass/NT.   Assessment/Plan:  32 y.o. G2P0020   1. Intrauterine contraceptive device threads lost, initial encounter Mirena IUD inserted in 2015.  IUD strings not visible.  Normal gynecologic exam otherwise and no pelvic pain.  Patient will follow-up for pelvic ultrasound to locate the IUD.  Recommend condom use in the meantime. - US Transvaginal Non-OB; Future  Counseling on above issues and coordination of care more than 50% for 15 minutes.  [name] MD, 1:04 PM 10/30/2017

## 2017-12-04 ENCOUNTER — Other Ambulatory Visit: Payer: Self-pay | Admitting: Obstetrics & Gynecology

## 2017-12-04 DIAGNOSIS — T8332XD Displacement of intrauterine contraceptive device, subsequent encounter: Secondary | ICD-10-CM

## 2017-12-12 ENCOUNTER — Ambulatory Visit: Payer: 59 | Admitting: Obstetrics & Gynecology

## 2017-12-12 ENCOUNTER — Other Ambulatory Visit: Payer: 59

## 2018-01-09 ENCOUNTER — Ambulatory Visit: Payer: Self-pay | Admitting: *Deleted

## 2018-01-09 NOTE — Telephone Encounter (Signed)
Pt called stating that she needs to talk to the doctor due to stress; states "it's probably work related"; she says that she has been feeling depressed; stressed, and anxious for 3 weeks; recommendations made per nurse triage protocol; pt offered and accepted appointment with Dr Nani Ravens, [place], 01/10/18 at 0915; she verbalizes understanding; will route to office for notification of this upcoming appointment.  Reason for Disposition . [1] Depression AND [2] worsening (e.g.,sleeping poorly, less able to do activities of daily living)  Answer Assessment - Initial Assessment Questions 1. CONCERN: "What happened that made you call today?"     unrealisitic job expectations  2. DEPRESSION SYMPTOM SCREENING: "How are you feeling overall?" (e.g., decreased energy, increased sleeping or difficulty sleeping, difficulty concentrating, feelings of sadness, guilt, hopelessness, or worthlessness)     Depressed, anxiety, trouble sleeping and concentrating 3. RISK OF HARM - SUICIDAL IDEATION:  "Do you ever have thoughts of hurting or killing yourself?"  (e.g., yes, no, no but preoccupation with thoughts about death)   - INTENT:  "Do you have thoughts of hurting or killing yourself right NOW?" (e.g., yes, no, N/A)   - PLAN: "Do you have a specific plan for how you would do this?" (e.g., gun, knife, overdose, no plan, N/A)     no 4. RISK OF HARM - HOMICIDAL IDEATION:  "Do you ever have thoughts of hurting or killing someone else?"  (e.g., yes, no, no but preoccupation with thoughts about death)   - INTENT:  "Do you have thoughts of hurting or killing someone right NOW?" (e.g., yes, no, N/A)   - PLAN: "Do you have a specific plan for how you would do this?" (e.g., gun, knife, no plan, N/A)      no 5. FUNCTIONAL IMPAIRMENT: "How have things been going for you overall in your life? Have you had any more difficulties than usual doing your normal daily activities?"  (e.g., better, same, worse; self-care, school,  work, interactions)     Stressful situation at work 6. SUPPORT: "Who is with you now?" "Who do you live with?" "Do you have family or friends nearby who you can talk to?"      Pt lives with boyfriend 7. THERAPIST: "Do you have a counselor or therapist? Name?"     no 8. STRESSORS: "Has there been any new stress or recent changes in your life?"     Job responsibilities have caused increased stress 9. DRUG ABUSE/ALCOHOL: "Do you drink alcohol or use any illegal drugs?"      Social drinker, no drugs 10. OTHER: "Do you have any other health or medical symptoms right now?" (e.g., fever)       no 11. PREGNANCY: "Is there any chance you are pregnant?" "When was your last menstrual period?"       No mirena  Protocols used: DEPRESSION-A-AH

## 2018-01-10 ENCOUNTER — Ambulatory Visit: Payer: 59 | Admitting: Family Medicine

## 2018-01-15 ENCOUNTER — Ambulatory Visit: Payer: 59 | Admitting: Obstetrics & Gynecology

## 2018-01-15 ENCOUNTER — Other Ambulatory Visit: Payer: Self-pay | Admitting: Obstetrics & Gynecology

## 2018-01-15 ENCOUNTER — Ambulatory Visit (INDEPENDENT_AMBULATORY_CARE_PROVIDER_SITE_OTHER): Payer: 59

## 2018-01-15 ENCOUNTER — Encounter: Payer: Self-pay | Admitting: Obstetrics & Gynecology

## 2018-01-15 DIAGNOSIS — N854 Malposition of uterus: Secondary | ICD-10-CM

## 2018-01-15 DIAGNOSIS — D252 Subserosal leiomyoma of uterus: Secondary | ICD-10-CM

## 2018-01-15 DIAGNOSIS — N831 Corpus luteum cyst of ovary, unspecified side: Secondary | ICD-10-CM

## 2018-01-15 DIAGNOSIS — Z30431 Encounter for routine checking of intrauterine contraceptive device: Secondary | ICD-10-CM

## 2018-01-15 DIAGNOSIS — D251 Intramural leiomyoma of uterus: Secondary | ICD-10-CM

## 2018-01-15 DIAGNOSIS — Z3169 Encounter for other general counseling and advice on procreation: Secondary | ICD-10-CM | POA: Diagnosis not present

## 2018-01-15 DIAGNOSIS — T8332XA Displacement of intrauterine contraceptive device, initial encounter: Secondary | ICD-10-CM

## 2018-01-15 DIAGNOSIS — T8332XD Displacement of intrauterine contraceptive device, subsequent encounter: Secondary | ICD-10-CM

## 2018-01-15 NOTE — Patient Instructions (Signed)
1. Intrauterine contraceptive device threads lost, subsequent encounter Mirena IUD visualized in normal intrauterine position by ultrasound today.  Ultrasound findings discussed with patient.  Uterine fibroids present but asymptomatic and not involving the intrauterine cavity.  Observation recommended and no need to remove surgically before conception.  2. Encounter for preconception consultation Will follow-up for Mirena IUD removal 1 month before attempting conception.  Will schedule Mirena IUD removal under ultrasound guidance because of the lost strings.  Recommend starting on prenatal vitamins 3 months before attempting conception.  Continue with fitness with aerobic physical activity 5 times a week and weightlifting every 2 days until ready to conceive.  Continue with healthy nutrition including varied vegetables, fruits and milk products.  Recommend mild weight loss with decrease total calories/carbs, such as [employer], to bring BMI in normal range prior to pregnancy.  [place], good seeing you today!

## 2018-01-15 NOTE — Progress Notes (Signed)
    SOLIMAR MAIDEN 1986/01/14 [id number]        32 y.o.  G2P0020   RP: IUD strings lost for Pelvic US  HPI: IUD strings not seen on last Gynecologic exam.  Normal light menses every month, no BTB.  No pelvic pain.  Considering attempting conception within a year.    OB History  Gravida Para Term Preterm AB Living  2       2 0  SAB TAB Ectopic Multiple Live Births    2          # Outcome Date GA Lbr Len/2nd Weight Sex Delivery Anes PTL Lv  2 TAB           1 TAB             Past medical history,surgical history, problem list, medications, allergies, family history and social history were all reviewed and documented in the EPIC chart.   Directed ROS with pertinent positives and negatives documented in the history of present illness/assessment and plan.  Exam:  There were no vitals filed for this visit. General appearance:  Normal  Pelvic US today: T/V and T/A images.  Retroverted uterus measuring 8.38 cm x 6.0 cm x 4.51 cm.  Intramural fibroids measuring 1.7 cm, 1.7 cm, 4.3 x 3.2 cm, 1 fundal exophytic fibroid measuring 4.5 x 2.4 cm, subserous fibroid measuring 1.8 cm.  Nabothian cyst present at the cervix.  Endometrial lining thin at 4.8 mm.  IUD seen in normal intrauterine position.  Right ovary normal.  Left ovary with a thick-walled follicle with internal low-level echoes measuring 1.8 x 1.4 cm, negative color flow Doppler to the follicle.  Free fluid in the left cul the sac measuring 2.4 x 1.5 cm.   Assessment/Plan:  32 y.o. G2P0020   1. Intrauterine contraceptive device threads lost, subsequent encounter Mirena IUD visualized in normal intrauterine position by ultrasound today.  Ultrasound findings discussed with patient.  Uterine fibroids present but asymptomatic and not involving the intrauterine cavity.  Observation recommended and no need to remove surgically before conception.  2. Encounter for preconception consultation Will follow-up for Mirena IUD removal 1 month  before attempting conception.  Will schedule Mirena IUD removal under ultrasound guidance because of the lost strings.  Recommend starting on prenatal vitamins 3 months before attempting conception.  Continue with fitness with aerobic physical activity 5 times a week and weightlifting every 2 days until ready to conceive.  Continue with healthy nutrition including varied vegetables, fruits and milk products.  Recommend mild weight loss with decrease total calories/carbs, such as [employer], to bring BMI in normal range prior to pregnancy.  Counseling on above issues and coordination of care more than 50% for 15 minutes.  [name] MD, 3:31 PM 01/15/2018

## 2018-02-07 ENCOUNTER — Ambulatory Visit: Payer: 59 | Admitting: Family Medicine

## 2018-03-26 ENCOUNTER — Ambulatory Visit: Payer: No Typology Code available for payment source | Admitting: Obstetrics & Gynecology

## 2018-03-26 ENCOUNTER — Encounter: Payer: Self-pay | Admitting: Obstetrics & Gynecology

## 2018-03-26 VITALS — BP 130/80

## 2018-03-26 DIAGNOSIS — Z30432 Encounter for removal of intrauterine contraceptive device: Secondary | ICD-10-CM

## 2018-03-26 NOTE — Progress Notes (Signed)
    Diana Mccormick 1986-03-11 [id number]        32 y.o.  G2P0020 Engaged  RP: IUD removal/Strings lost  HPI: Desires removal of Mirena IUD as she plans to attempt conception in March 2020.  IUD strings were lost on last exams with pelvic ultrasound confirmation of intrauterine IUD on January 15, 2018.  No pelvic pain.  No abnormal bleeding.   OB History  Gravida Para Term Preterm AB Living  2       2 0  SAB TAB Ectopic Multiple Live Births    2          # Outcome Date GA Lbr Len/2nd Weight Sex Delivery Anes PTL Lv  2 TAB           1 TAB             Past medical history,surgical history, problem list, medications, allergies, family history and social history were all reviewed and documented in the EPIC chart.   Directed ROS with pertinent positives and negatives documented in the history of present illness/assessment and plan.  Exam:  Vitals:   03/26/18 1144  BP: 130/80   General appearance:  Normal   Gynecologic exam: Vulva normal.  Speculum: Cervix and vagina normal.  Normal vaginal secretions.  No bleeding.  IUD strings not visible.  Betadine prep done on cervix.  Spray of Hurricaine on cervix.  IUD removal clamp used with IUD strings grasped in the endocervix.  Easy removal of IUD.  IUD shown to patient, complete and intact.  IUD discarded.  Procedure well tolerated with no complication.   Assessment/Plan:  32 y.o. G2P0020   1. Encounter for IUD removal Desired IUD removal as she is ready to attempt conception.  IUD string lost.  Easy removal of an intact complete IUD with the IUD removal clamp reaching the strings in the endocervix.  Will start on prenatal vitamins.  Preconception counseling done.  Counseling on above issues and coordination of care more than 50% for 10 minutes.  Diana Bruins MD, 11:46 AM 03/26/2018

## 2018-03-26 NOTE — Patient Instructions (Signed)
1. Encounter for IUD removal Desired IUD removal as she is ready to attempt conception.  IUD string lost.  Easy removal of an intact complete IUD with the IUD removal clamp reaching the strings in the endocervix.  Will start on prenatal vitamins.  Preconception counseling done.  Diana Mccormick, it was a pleasure seeing you today!

## 2018-05-10 ENCOUNTER — Ambulatory Visit: Payer: Self-pay

## 2018-05-10 ENCOUNTER — Encounter: Payer: Self-pay | Admitting: Family Medicine

## 2018-05-10 ENCOUNTER — Ambulatory Visit (INDEPENDENT_AMBULATORY_CARE_PROVIDER_SITE_OTHER): Payer: No Typology Code available for payment source | Admitting: Family Medicine

## 2018-05-10 VITALS — BP 108/68 | HR 91 | Temp 98.8°F | Ht 66.0 in | Wt 168.5 lb

## 2018-05-10 DIAGNOSIS — J4521 Mild intermittent asthma with (acute) exacerbation: Secondary | ICD-10-CM | POA: Diagnosis not present

## 2018-05-10 MED ORDER — PREDNISONE 20 MG PO TABS: 40.0000 mg | ORAL_TABLET | Freq: Every day | ORAL | 0 refills | Status: AC

## 2018-05-10 MED ORDER — ALBUTEROL SULFATE (2.5 MG/3ML) 0.083% IN NEBU: 2.5000 mg | INHALATION_SOLUTION | RESPIRATORY_TRACT | 1 refills | Status: AC | PRN

## 2018-05-10 NOTE — Telephone Encounter (Signed)
Patient called and before the call could be transferred to a [place], she hung up. [facility] [place], [place], says that patient called in with c/o "chest tightness, not able to breathe, inhaler not working." I called the patient back, left VM to return call to the office to discuss her symptoms.

## 2018-05-10 NOTE — Progress Notes (Signed)
Chief Complaint  Patient presents with  . Cough  . Shortness of Breath    Diana Mccormick here for URI complaints.  Duration: 2 days  Associated symptoms: Fever (100.1 F)- resolved 2 d ago, shortness of breath, chest tightness, myalgia and cough Denies: sinus pain, itchy watery eyes, ear pain, ear drainage, sore throat and wheezing Treatment to date: SABA, LABA/ICS, Dayquil/Nyquil Sick contacts: Yes  URI s/s's resolved, started getting asthma s/s's this AM  ROS:  Const: Denies recent fevers HEENT: As noted in HPI Lungs: +SOB  Past Medical History:  Diagnosis Date  . Asthma   . Fibroid   . Migraine     BP 108/68 (BP Location: Left Arm, Patient Position: Sitting, Cuff Size: Normal)   Pulse 91   Temp 98.8 F (37.1 C) (Oral)   Ht 5\' 6"  (1.676 m)   Wt 168 lb 8 oz (76.4 kg)   SpO2 99%   BMI 27.20 kg/m  General: Awake, alert, appears stated age HEENT: AT, [place], ears patent b/l and TM's neg, nares patent w/o discharge, pharynx pink and without exudates, MMM Neck: No masses or asymmetry Heart: RRR Lungs: Poor airflow, no wheezing or rales, no accessory muscle use Psych: Age appropriate judgment and insight, normal mood and affect  Mild intermittent asthma with exacerbation - Plan: albuterol (PROVENTIL) (2.5 MG/3ML) 0.083% nebulizer solution, predniSONE (DELTASONE) 20 MG tablet, DME Nebulizer machine  Orders as above. She will cont ICS/LABA bid use. If no improvement, will take pred tomorrow.  Continue to push fluids, practice good hand hygiene, cover mouth when coughing. F/u prn. If starting to experience fevers, shaking, or shortness of breath, seek immediate care. Pt voiced understanding and agreement to the plan.  [place], DO 05/10/18 3:37 PM

## 2018-05-10 NOTE — Progress Notes (Signed)
Pre visit review using our clinic review tool, if applicable. No additional management support is needed unless otherwise documented below in the visit note. 

## 2018-05-10 NOTE — Telephone Encounter (Signed)
Patient is currently in the office seeing PCP

## 2018-05-10 NOTE — Patient Instructions (Signed)
Continue the Symbicort twice daily. remember to rinse your mouth out after each use. Use the prednisone if no improvement by tomorrow evening.  I would recommend scheduled albuterol neb treatments every 4-6 hours and use the inhaler/neb every 2 hours as needed.  Continue to push fluids, practice good hand hygiene, and cover your mouth if you cough.  If you start having fevers, shaking or shortness of breath, seek immediate care.  Let us know if you need anything.

## 2018-07-09 ENCOUNTER — Telehealth: Payer: No Typology Code available for payment source | Admitting: Family

## 2018-07-09 ENCOUNTER — Encounter: Payer: Self-pay | Admitting: Family

## 2018-07-09 DIAGNOSIS — R05 Cough: Secondary | ICD-10-CM

## 2018-07-09 DIAGNOSIS — R059 Cough, unspecified: Secondary | ICD-10-CM

## 2018-07-09 MED ORDER — PROMETHAZINE-DM 6.25-15 MG/5ML PO SYRP: 5.0000 mL | ORAL_SOLUTION | Freq: Four times a day (QID) | ORAL | 0 refills | Status: DC | PRN

## 2018-07-09 NOTE — Progress Notes (Signed)

## 2018-07-12 ENCOUNTER — Encounter: Payer: Self-pay | Admitting: Family Medicine

## 2018-08-10 ENCOUNTER — Encounter: Payer: Self-pay | Admitting: Family Medicine

## 2018-11-01 ENCOUNTER — Other Ambulatory Visit: Payer: Self-pay

## 2018-11-02 ENCOUNTER — Encounter: Payer: Self-pay | Admitting: Obstetrics & Gynecology

## 2018-11-02 ENCOUNTER — Ambulatory Visit: Payer: No Typology Code available for payment source | Admitting: Obstetrics & Gynecology

## 2018-11-02 VITALS — BP 124/90

## 2018-11-02 DIAGNOSIS — N912 Amenorrhea, unspecified: Secondary | ICD-10-CM | POA: Diagnosis not present

## 2018-11-02 DIAGNOSIS — Z3200 Encounter for pregnancy test, result unknown: Secondary | ICD-10-CM | POA: Diagnosis not present

## 2018-11-02 LAB — PREGNANCY, URINE: Preg Test, Ur: POSITIVE — AB

## 2018-11-02 NOTE — Progress Notes (Signed)
    Diana Mccormick 04-May-1985 [id number]        33 y.o.  G2P0020 Newly married.  LMP 10/07/2018   RP: HPT positive for confirmation of pregnancy  HPI: LMP 10/07/2018 at 3 wks 5/7. EDD 07/13/2019.  No pelvic pain.  No vaginal bleeding.  Patient is fit, has a healthy nutrition and taking her prenatal vitamins.  Urine and bowel movements normal.   OB History  Gravida Para Term Preterm AB Living  2       2 0  SAB TAB Ectopic Multiple Live Births    2          # Outcome Date GA Lbr Len/2nd Weight Sex Delivery Anes PTL Lv  2 TAB           1 TAB             Past medical history,surgical history, problem list, medications, allergies, family history and social history were all reviewed and documented in the EPIC chart.   Directed ROS with pertinent positives and negatives documented in the history of present illness/assessment and plan.  Exam:  Vitals:   11/02/18 0853  BP: 124/90   General appearance:  Normal  Abdomen: Normal  Gynecologic exam: Vulva normal.  Bimanual exam: Cervix long, closed, firm.  Uterus anteverted, normal volume, nontender, mobile.  No adnexal mass felt, nontender bilaterally.  Normal secretions and no blood.  Urine pregnancy test positive.   Assessment/Plan:  33 y.o. G2P0020   1. Amenorrhea Urine pregnancy test positive. - Pregnancy, urine  2. Encounter for confirmation of pregnancy test result with physical examination Urine pregnancy test positive.  Last menstrual period October 07, 2022 and expected date of delivery July 13, 2019.  3 weeks and 5 days gestation.  33 year old healthy patient with desired pregnancy.  Good nutrition and taking prenatal vitamins.  We will follow-up with an OB ultrasound to confirm dating and viability at 6+ weeks.  Precautions discussed.  Would like to be followed by a midwife.  Will refer at follow-up. - US OB Transvaginal; Future  Other orders - albuterol (VENTOLIN HFA) 108 (90 Base) MCG/ACT inhaler; Inhale into the lungs  as directed.  Counseling on above issues and coordination of care more than 50% for 15 minutes.  [name] MD, 9:01 AM 11/02/2018

## 2018-11-02 NOTE — Patient Instructions (Signed)
1. Amenorrhea Urine pregnancy test positive. - Pregnancy, urine  2. Encounter for confirmation of pregnancy test result with physical examination Urine pregnancy test positive.  Last menstrual period October 07, 2022 and expected date of delivery July 13, 2019.  3 weeks and 5 days gestation.  33 year old healthy patient with desired pregnancy.  Good nutrition and taking prenatal vitamins.  We will follow-up with an OB ultrasound to confirm dating and viability at 6+ weeks.  Precautions discussed.  Would like to be followed by a midwife.  Will refer at follow-up. - US OB Transvaginal; Future  Other orders - albuterol (VENTOLIN HFA) 108 (90 Base) MCG/ACT inhaler; Inhale into the lungs as directed.  [place], it was a pleasure seeing you today and all my congratulations!

## 2018-11-10 ENCOUNTER — Inpatient Hospital Stay (HOSPITAL_COMMUNITY): Payer: No Typology Code available for payment source

## 2018-11-10 ENCOUNTER — Encounter (HOSPITAL_COMMUNITY): Payer: Self-pay

## 2018-11-10 ENCOUNTER — Other Ambulatory Visit: Payer: Self-pay

## 2018-11-10 ENCOUNTER — Inpatient Hospital Stay (HOSPITAL_COMMUNITY)
Admission: AD | Admit: 2018-11-10 | Discharge: 2018-11-10 | Disposition: A | Discharge status: Home / Self Care | Payer: No Typology Code available for payment source | Attending: Obstetrics and Gynecology | Admitting: Obstetrics and Gynecology

## 2018-11-10 DIAGNOSIS — O200 Threatened abortion: Secondary | ICD-10-CM | POA: Diagnosis not present

## 2018-11-10 DIAGNOSIS — Z3A01 Less than 8 weeks gestation of pregnancy: Secondary | ICD-10-CM | POA: Diagnosis not present

## 2018-11-10 DIAGNOSIS — Z87891 Personal history of nicotine dependence: Secondary | ICD-10-CM | POA: Diagnosis not present

## 2018-11-10 DIAGNOSIS — O209 Hemorrhage in early pregnancy, unspecified: Secondary | ICD-10-CM | POA: Diagnosis present

## 2018-11-10 DIAGNOSIS — Z6710 Type A blood, Rh positive: Secondary | ICD-10-CM

## 2018-11-10 LAB — ABO/RH: ABO/RH(D): A POS

## 2018-11-10 LAB — CBC WITH DIFFERENTIAL/PLATELET
Abs Immature Granulocytes: 0 10*3/uL (ref 0.00–0.07)
Basophils Absolute: 0 10*3/uL (ref 0.0–0.1)
Basophils Relative: 1 %
Eosinophils Absolute: 0.1 10*3/uL (ref 0.0–0.5)
Eosinophils Relative: 3 %
HCT: 36.6 % (ref 36.0–46.0)
Hemoglobin: 12.3 g/dL (ref 12.0–15.0)
Immature Granulocytes: 0 %
Lymphocytes Relative: 40 %
Lymphs Abs: 2.1 10*3/uL (ref 0.7–4.0)
MCH: 29.4 pg (ref 26.0–34.0)
MCHC: 33.6 g/dL (ref 30.0–36.0)
MCV: 87.4 fL (ref 80.0–100.0)
Monocytes Absolute: 0.4 10*3/uL (ref 0.1–1.0)
Monocytes Relative: 7 %
Neutro Abs: 2.6 10*3/uL (ref 1.7–7.7)
Neutrophils Relative %: 49 %
Platelets: 237 10*3/uL (ref 150–400)
RBC: 4.19 MIL/uL (ref 3.87–5.11)
RDW: 11.9 % (ref 11.5–15.5)
WBC: 5.2 10*3/uL (ref 4.0–10.5)
nRBC: 0 % (ref 0.0–0.2)

## 2018-11-10 LAB — COMPREHENSIVE METABOLIC PANEL
ALT: 20 U/L (ref 0–44)
AST: 23 U/L (ref 15–41)
Albumin: 3.9 g/dL (ref 3.5–5.0)
Alkaline Phosphatase: 31 U/L — ABNORMAL LOW (ref 38–126)
Anion gap: 8 (ref 5–15)
BUN: 8 mg/dL (ref 6–20)
CO2: 24 mmol/L (ref 22–32)
Calcium: 9.2 mg/dL (ref 8.9–10.3)
Chloride: 107 mmol/L (ref 98–111)
Creatinine, Ser: 0.86 mg/dL (ref 0.44–1.00)
GFR calc Af Amer: >60 mL/min (ref >60)
GFR calc non Af Amer: >60 mL/min (ref >60)
Glucose, Bld: 94 mg/dL (ref 70–99)
Potassium: 3.8 mmol/L (ref 3.5–5.1)
Sodium: 139 mmol/L (ref 135–145)
Total Bilirubin: 0.5 mg/dL (ref 0.3–1.2)
Total Protein: 6.9 g/dL (ref 6.5–8.1)

## 2018-11-10 LAB — URINALYSIS, ROUTINE W REFLEX MICROSCOPIC
Bilirubin Urine: NEGATIVE
Glucose, UA: NEGATIVE mg/dL
Ketones, ur: NEGATIVE mg/dL
Leukocytes,Ua: NEGATIVE
Nitrite: NEGATIVE
Protein, ur: NEGATIVE mg/dL
RBC / HPF: >50 RBC/hpf — ABNORMAL HIGH (ref 0–5)
Specific Gravity, Urine: 1.009 (ref 1.005–1.030)
pH: 5 (ref 5.0–8.0)

## 2018-11-10 LAB — HCG, QUANTITATIVE, PREGNANCY: hCG, Beta Chain, Quant, S: 16 m[IU]/mL — ABNORMAL HIGH (ref <5)

## 2018-11-10 NOTE — MAU Provider Note (Signed)
History     CSN: [id number]  Arrival date and time: 11/10/18 1137   First Provider Initiated Contact with Patient 11/10/18 1234      Chief Complaint  Patient presents with  . Vaginal Bleeding  . Abdominal Pain   HPI   [name] is a 33 y.o. female G3P0020 @ 104w6d here in MAU with vaginal bleeding that was first noted this morning. The bleeding was dark and at times bright red. She is concerned about the amount of bleeding she is having. The pain is all over her abdomen, at this time it is worse in the LLQ. The pain started last night. She had a positive pregnancy test in the office on 11/02/2018.   OB History    Gravida  3   Para      Term      Preterm      AB  2   Living  0     SAB      TAB  2   Ectopic      Multiple      Live Births              Past Medical History:  Diagnosis Date  . Asthma   . Fibroid   . Migraine     History reviewed. No pertinent surgical history.  Family History  Problem Relation Age of Onset  . Hypertension Other   . Hypertension Maternal Aunt   . Cancer Maternal Grandmother        cervical    Social History   Tobacco Use  . Smoking status: Former Smoker    Quit date: 10/03/2018    Years since quitting: 0.1  . Smokeless tobacco: Never Used  Substance Use Topics  . Alcohol use: Not Currently    Alcohol/week: 0.0 standard drinks  . Drug use: No    Allergies:  Allergies  Allergen Reactions  . Bactrim [Sulfamethoxazole-Trimethoprim] Anaphylaxis    Hypotension and Fever-like symptoms  . Sulfur Anaphylaxis    Low heart rate    Medications Prior to Admission  Medication Sig Dispense Refill Last Dose  . albuterol (PROVENTIL) (2.5 MG/3ML) 0.083% nebulizer solution Take 3 mLs (2.5 mg total) by nebulization every 4 (four) hours as needed for wheezing or shortness of breath. 75 mL 1   . albuterol (VENTOLIN HFA) 108 (90 Base) MCG/ACT inhaler Inhale into the lungs as directed.     . budesonide-formoterol  (SYMBICORT) 160-4.5 MCG/ACT inhaler Inhale 2 puffs into the lungs 2 (two) times daily. 1 Inhaler 3   . naproxen sodium (ALEVE) 220 MG tablet Take 220 mg by mouth.      Results for orders placed or performed during the hospital encounter of 11/10/18 (from the past 48 hour(s))  Urinalysis, Routine w reflex microscopic     Status: Abnormal   Collection Time: 11/10/18 12:15 PM  Result Value Ref Range   Color, Urine YELLOW YELLOW   APPearance CLEAR CLEAR   Specific Gravity, Urine 1.009 1.005 - 1.030   pH 5.0 5.0 - 8.0   Glucose, UA NEGATIVE NEGATIVE mg/dL   Hgb urine dipstick LARGE (A) NEGATIVE   Bilirubin Urine NEGATIVE NEGATIVE   Ketones, ur NEGATIVE NEGATIVE mg/dL   Protein, ur NEGATIVE NEGATIVE mg/dL   Nitrite NEGATIVE NEGATIVE   Leukocytes,Ua NEGATIVE NEGATIVE   RBC / HPF >50 (H) 0 - 5 RBC/hpf   WBC, UA 0-5 0 - 5 WBC/hpf   Bacteria, UA FEW (A) NONE SEEN   Squamous  Epithelial / LPF 0-5 0 - 5   Mucus PRESENT     Comment: Performed at [facility], [place] [address] [place], [place] [address]  CBC with Differential/Platelet     Status: None   Collection Time: 11/10/18  1:27 PM  Result Value Ref Range   WBC 5.2 4.0 - 10.5 K/uL   RBC 4.19 3.87 - 5.11 MIL/uL   Hemoglobin 12.3 12.0 - 15.0 g/dL   HCT 36.6 36.0 - 46.0 %   MCV 87.4 80.0 - 100.0 fL   MCH 29.4 26.0 - 34.0 pg   MCHC 33.6 30.0 - 36.0 g/dL   RDW 11.9 11.5 - 15.5 %   Platelets 237 150 - 400 K/uL   nRBC 0.0 0.0 - 0.2 %   Neutrophils Relative % 49 %   Neutro Abs 2.6 1.7 - 7.7 K/uL   Lymphocytes Relative 40 %   Lymphs Abs 2.1 0.7 - 4.0 K/uL   Monocytes Relative 7 %   Monocytes Absolute 0.4 0.1 - 1.0 K/uL   Eosinophils Relative 3 %   Eosinophils Absolute 0.1 0.0 - 0.5 K/uL   Basophils Relative 1 %   Basophils Absolute 0.0 0.0 - 0.1 K/uL   Immature Granulocytes 0 %   Abs Immature Granulocytes 0.00 0.00 - 0.07 K/uL    Comment: Performed at [facility], [address], [place], [place] [address]  Comprehensive  metabolic panel     Status: Abnormal   Collection Time: 11/10/18  1:27 PM  Result Value Ref Range   Sodium 139 135 - 145 mmol/L   Potassium 3.8 3.5 - 5.1 mmol/L   Chloride 107 98 - 111 mmol/L   CO2 24 22 - 32 mmol/L   Glucose, Bld 94 70 - 99 mg/dL   BUN 8 6 - 20 mg/dL   Creatinine, Ser 0.86 0.44 - 1.00 mg/dL   Calcium 9.2 8.9 - 10.3 mg/dL   Total Protein 6.9 6.5 - 8.1 g/dL   Albumin 3.9 3.5 - 5.0 g/dL   AST 23 15 - 41 U/L   ALT 20 0 - 44 U/L   Alkaline Phosphatase 31 (L) 38 - 126 U/L   Total Bilirubin 0.5 0.3 - 1.2 mg/dL   GFR calc non Af Amer >60 >60 mL/min   GFR calc Af Amer >60 >60 mL/min   Anion gap 8 5 - 15    Comment: Performed at [facility] [address] [place], [place] [address]  ABO/Rh     Status: None   Collection Time: 11/10/18  1:27 PM  Result Value Ref Range   ABO/RH(D) A POS    No rh immune globuloin      NOT A RH IMMUNE GLOBULIN CANDIDATE, PT RH POSITIVE Performed at [facility] [address], [place], [place] [address]   hCG, quantitative, pregnancy     Status: Abnormal   Collection Time: 11/10/18  1:27 PM  Result Value Ref Range   hCG, Beta Chain, Quant, S 16 (H) <5 mIU/mL    Comment:          GEST. AGE      CONC.  (mIU/mL)   <=1 WEEK        5 - 50     2 WEEKS       50 - 500     3 WEEKS       100 - 10,000     4 WEEKS     1,000 -  30,000     5 WEEKS     3,500 - 115,000   6-8 WEEKS     12,000 - 270,000    12 WEEKS     15,000 - 220,000        FEMALE AND NON-PREGNANT FEMALE:     LESS THAN 5 mIU/mL Performed at [facility], [place] [address], [place], [place] [address]     Review of Systems  Constitutional: Negative for fever.  Gastrointestinal: Positive for abdominal pain.  Genitourinary: Negative for vaginal bleeding.   Physical Exam   Blood pressure 116/72, pulse 64, temperature 98.4 F (36.9 C), temperature source Oral, resp. rate 18, weight 85 kg, last menstrual period 10/07/2018, SpO2 100 %.  Physical Exam   Constitutional: She is oriented to person, place, and time. She appears well-developed and well-nourished. No distress.  HENT:  Head: Normocephalic.  GI: Soft. She exhibits no distension. There is no abdominal tenderness. There is no rebound and no guarding.  Genitourinary:    Genitourinary Comments: Cervix: FT, anterior. Small- moderate amount of dark red blood noted on pad and exam glove.    Musculoskeletal: Normal range of motion.  Neurological: She is alert and oriented to person, place, and time.  Skin: Skin is warm. She is not diaphoretic.  Psychiatric: Her behavior is normal.   MAU Course  Procedures  None  MDM  HIV, CBC, Hcg, ABO US OB transvaginal  A positive blood type   Assessment and Plan   A:  1. Threatened miscarriage in early pregnancy   2. Vaginal bleeding in pregnancy, first trimester   3. Type A blood, Rh positive     P:  Discharge home with strict return precautions Pelvic rest Bleeding precautions Return to MAU on Friday evening for stat Quant. She is starting a new job on Monday and going to the office for lab work would be inconvenient for her.  Support given Chart routed to Dr. Everett Graff, Artist Pais, NP 11/10/2018 3:05 PM

## 2018-11-10 NOTE — Discharge Instructions (Signed)
Miscarriage °A miscarriage is the loss of an unborn baby (fetus) before the 20th week of pregnancy. Most miscarriages happen during the first 3 months of pregnancy. Sometimes, a miscarriage can happen before a woman knows that she is pregnant. °Having a miscarriage can be an emotional experience. If you have had a miscarriage, talk with your health care provider about any questions you may have about miscarrying, the grieving process, and your plans for future pregnancy. °What are the causes? °A miscarriage may be caused by: °· Problems with the genes or chromosomes of the fetus. These problems make it impossible for the baby to develop normally. They are often the result of random errors that occur early in the development of the baby, and are not passed from parent to child (not inherited). °· Infection of the cervix or uterus. °· Conditions that affect hormone balance in the body. °· Problems with the cervix, such as the cervix opening and thinning before pregnancy is at term (cervical insufficiency). °· Problems with the uterus. These may include: °? A uterus with an abnormal shape. °? Fibroids in the uterus. °? Congenital abnormalities. These are problems that were present at birth. °· Certain medical conditions. °· Smoking, drinking alcohol, or using drugs. °· Injury (trauma). °In many cases, the cause of a miscarriage is not known. °What are the signs or symptoms? °Symptoms of this condition include: °· Vaginal bleeding or spotting, with or without cramps or pain. °· Pain or cramping in the abdomen or lower back. °· Passing fluid, tissue, or blood clots from the vagina. °How is this diagnosed? °This condition may be diagnosed based on: °· A physical exam. °· Ultrasound. °· Blood tests. °· Urine tests. °How is this treated? °Treatment for a miscarriage is sometimes not necessary if you naturally pass all the tissue that was in your uterus. If necessary, this condition may be treated with: °· Dilation and  curettage (D&C). This is a procedure in which the cervix is stretched open and the lining of the uterus (endometrium) is scraped. This is done only if tissue from the fetus or placenta remains in the body (incomplete miscarriage). °· Medicines, such as: °? Antibiotic medicine, to treat infection. °? Medicine to help the body pass any remaining tissue. °? Medicine to reduce (contract) the size of the uterus. These medicines may be given if you have a lot of bleeding. °If you have Rh negative blood and your baby was Rh positive, you will need a shot of a medicine called Rh immunoglobulinto protect your future babies from Rh blood problems. "Rh-negative" and "Rh-positive" refer to whether or not the blood has a specific protein found on the surface of red blood cells (Rh factor). °Follow these instructions at home: °Medicines ° °· Take over-the-counter and prescription medicines only as told by your health care provider. °· If you were prescribed antibiotic medicine, take it as told by your health care provider. Do not stop taking the antibiotic even if you start to feel better. °· Do not take NSAIDs, such as aspirin and ibuprofen, unless they are approved by your health care provider. These medicines can cause bleeding. °Activity °· Rest as directed. Ask your health care provider what activities are safe for you. °· Have someone help with home and family responsibilities during this time. °General instructions °· Keep track of the number of sanitary pads you use each day and how soaked (saturated) they are. Write down this information. °· Monitor the amount of tissue or blood clots that   you pass from your vagina. Save any large amounts of tissue for your health care provider to examine.  Do not use tampons, douche, or have sex until your health care provider approves.  To help you and your partner with the process of grieving, talk with your health care provider or seek counseling.  When you are ready, meet with  your health care provider to discuss any important steps you should take for your health. Also, discuss steps you should take to have a healthy pregnancy in the future.  Keep all follow-up visits as told by your health care provider. This is important. Where to find more information  The American Congress of Obstetricians and Gynecologists: www.acog.org  U.S. Department of Health and Programmer, systems of Womens Health: [place] Contact a health care provider if:  You have a fever or chills.  You have a foul smelling vaginal discharge.  You have more bleeding instead of less. Get help right away if:  You have severe cramps or pain in your back or abdomen.  You pass blood clots or tissue from your vagina that is walnut-sized or larger.  You soak more than 1 regular sanitary pad in an hour.  You become light-headed or weak.  You pass out.  You have feelings of sadness that take over your thoughts, or you have thoughts of hurting yourself. Summary  Most miscarriages happen in the first 3 months of pregnancy. Sometimes miscarriage happens before a woman even knows that she is pregnant.  Follow your health care provider's instruction for home care. Keep all follow-up appointments.  To help you and your partner with the process of grieving, talk with your health care provider or seek counseling. This information is not intended to replace advice given to you by your health care provider. Make sure you discuss any questions you have with your health care provider. Document Released: 09/21/2000 Document Revised: 07/20/2018 Document Reviewed: 05/03/2016 Elsevier Patient Education  2020 [employer].

## 2018-11-10 NOTE — MAU Note (Signed)
Diana Mccormick is a 33 y.o. at Unknown here in MAU reporting:  +vaginal bleeding Went from brown to red Denies recent IC; about 2 weeks ago  +lower abdominal cramping Tried heat pad  LMP: June 28 +pregnancy test on 11/02/18  Onset of complaint: this morning Pain score: 5/10 Vitals:   11/10/18 1200  BP: 116/72  Pulse: 64  Resp: 18  Temp: 98.4 F (36.9 C)  SpO2: 100%     Lab orders placed from triage: ua

## 2018-11-12 ENCOUNTER — Inpatient Hospital Stay (HOSPITAL_COMMUNITY)
Admission: AD | Admit: 2018-11-12 | Discharge: 2018-11-12 | Disposition: A | Discharge status: Home / Self Care | Payer: No Typology Code available for payment source | Attending: Obstetrics and Gynecology | Admitting: Obstetrics and Gynecology

## 2018-11-12 ENCOUNTER — Other Ambulatory Visit: Payer: Self-pay

## 2018-11-12 DIAGNOSIS — O039 Complete or unspecified spontaneous abortion without complication: Secondary | ICD-10-CM | POA: Diagnosis not present

## 2018-11-12 DIAGNOSIS — O3680X0 Pregnancy with inconclusive fetal viability, not applicable or unspecified: Secondary | ICD-10-CM

## 2018-11-12 LAB — HCG, QUANTITATIVE, PREGNANCY: hCG, Beta Chain, Quant, S: 13 m[IU]/mL — ABNORMAL HIGH (ref <5)

## 2018-11-12 NOTE — Discharge Instructions (Signed)
Miscarriage A miscarriage is the loss of an unborn baby (fetus) before the 20th week of pregnancy. Follow these instructions at home: Medicines   Take over-the-counter and prescription medicines only as told by your doctor.  If you were prescribed antibiotic medicine, take it as told by your doctor. Do not stop taking the antibiotic even if you start to feel better.  Do not take NSAIDs unless your doctor says that this is safe for you. NSAIDs include aspirin and ibuprofen. These medicines can cause bleeding. Activity  Rest as directed. Ask your doctor what activities are safe for you.  Have someone help you at home during this time. General instructions  Write down how many pads you use each day and how soaked they are.  Watch the amount of tissue or clumps of blood (blood clots) that you pass from your vagina. Save any large amounts of tissue for your doctor.  Do not use tampons, douche, or have sex until your doctor approves.  To help you and your partner with the process of grieving, talk with your doctor or seek counseling.  When you are ready, meet with your doctor to talk about steps you should take for your health. Also, talk with your doctor about steps to take to have a healthy pregnancy in the future.  Keep all follow-up visits as told by your doctor. This is important. Contact a doctor if:  You have a fever or chills.  You have vaginal discharge that smells bad.  You have more bleeding. Get help right away if:  You have very bad cramps or pain in your back or belly.  You pass clumps of blood that are walnut-sized or larger from your vagina.  You pass tissue that is walnut-sized or larger from your vagina.  You soak more than 1 regular pad in an hour.  You get light-headed or weak.  You faint (pass out).  You have feelings of sadness that do not go away, or you have thoughts of hurting yourself. Summary  A miscarriage is the loss of an unborn baby before  the 20th week of pregnancy.  Follow your doctor's instructions for home care. Keep all follow-up appointments.  To help you and your partner with the process of grieving, talk with your doctor or seek counseling. This information is not intended to replace advice given to you by your health care provider. Make sure you discuss any questions you have with your health care provider. Document Released: 06/20/2011 Document Revised: 07/20/2018 Document Reviewed: 05/03/2016 Elsevier Patient Education  2020 [employer].

## 2018-11-12 NOTE — MAU Provider Note (Signed)
Subjective:  Diana Mccormick is a 33 y.o. G3P0020 at [redacted]w[redacted]d who presents today for FU BHCG. She was seen on 11/10/2018. Results from that day show no IUP on [place], and HCG 16. She reports light vaginal bleeding. She denies abdominal or pelvic pain.  Objective:  Physical Exam  Nursing note and vitals reviewed. Constitutional: She is oriented to person, place, and time. She appears well-developed and well-nourished. No distress.  HENT:  Head: Normocephalic.  Cardiovascular: Normal rate.  Respiratory: Effort normal.  GI: Soft. There is no tenderness.  Neurological: She is alert and oriented to person, place, and time. Skin: Skin is warm and dry.  Psychiatric: She has a normal mood and affect.   Results for orders placed or performed during the hospital encounter of 11/12/18 (from the past 24 hour(s))  hCG, quantitative, pregnancy     Status: Abnormal   Collection Time: 11/12/18  5:17 PM  Result Value Ref Range   hCG, Beta Chain, Quant, S 13 (H) <5 mIU/mL   Hcg level down from 16>13 A positive blood type   Assessment/Plan: Pregnancy of unknown location SAB in progress  HCG did not rise appropriately FU in 1 weeks for: Repeat Quant. She has an office appointment scheduled next week Return to MAU if symptoms worsen  Support given    Noni Saupe I, NP 11/12/2018 8:13 PM

## 2018-11-12 NOTE — MAU Note (Signed)
Doing ok.  No pain, bleeding stopped today.

## 2018-11-13 ENCOUNTER — Telehealth: Payer: Self-pay | Admitting: *Deleted

## 2018-11-13 NOTE — Telephone Encounter (Signed)
Dr.Lavoie patient called back from my message below she had ultrasound at [facility] on 11/10/18 ( results in epic) had repeat hCG, Beta Chain, Quant, done yesterday as well.  She asked if ultrasound on 11/22/18 still needs to be done?  Please advise

## 2018-11-13 NOTE — Telephone Encounter (Signed)
(   late entry from 11/12/18) Patient was seen at MAU on 11/10/18 for SAB Dr.Lavoie was informed with this by me and NP Noni Saupe, patient is scheduled for ultrasound on 11/22/18. Per Dr.Lavoie if patient is okay to wait until this appointment to follow up. I called patient on and left message on 11/12/18 to call me to see if she is okay to wait until scheduled ultrasound.

## 2018-11-14 NOTE — Telephone Encounter (Signed)
Patient informed. 

## 2018-11-14 NOTE — Telephone Encounter (Signed)
I reviewed the Ob US done 11/10/2018.  Complete Spontaneous Ab.  Endometrial line normal.  No need to repeat an [place].

## 2018-11-15 ENCOUNTER — Encounter: Payer: Self-pay | Admitting: Family Medicine

## 2018-11-16 ENCOUNTER — Other Ambulatory Visit: Payer: Self-pay | Admitting: Family Medicine

## 2018-11-16 ENCOUNTER — Telehealth: Payer: Self-pay | Admitting: *Deleted

## 2018-11-16 DIAGNOSIS — O039 Complete or unspecified spontaneous abortion without complication: Secondary | ICD-10-CM

## 2018-11-16 NOTE — Telephone Encounter (Signed)
Copied from [place] [id number]. Topic: Appointment Scheduling - Scheduling Inquiry for Clinic >> Nov 16, 2018 10:10 AM Burchel, Abbi R wrote: Reason for CRM: Pt would like to sched appt for lab work.  Please call to sched: [phone]

## 2018-11-16 NOTE — Progress Notes (Signed)
HCG

## 2018-11-22 ENCOUNTER — Ambulatory Visit: Payer: No Typology Code available for payment source | Admitting: Obstetrics & Gynecology

## 2018-11-22 ENCOUNTER — Other Ambulatory Visit: Payer: No Typology Code available for payment source

## 2018-11-23 ENCOUNTER — Other Ambulatory Visit (INDEPENDENT_AMBULATORY_CARE_PROVIDER_SITE_OTHER): Payer: No Typology Code available for payment source

## 2018-11-23 ENCOUNTER — Other Ambulatory Visit: Payer: Self-pay

## 2018-11-23 DIAGNOSIS — O039 Complete or unspecified spontaneous abortion without complication: Secondary | ICD-10-CM | POA: Diagnosis not present

## 2018-11-24 ENCOUNTER — Other Ambulatory Visit: Payer: Self-pay | Admitting: Family Medicine

## 2018-11-24 DIAGNOSIS — O039 Complete or unspecified spontaneous abortion without complication: Secondary | ICD-10-CM

## 2018-11-24 LAB — HCG, SERUM, QUALITATIVE: Preg, Serum: NEGATIVE

## 2018-12-04 ENCOUNTER — Other Ambulatory Visit: Payer: Self-pay

## 2018-12-04 DIAGNOSIS — Z369 Encounter for antenatal screening, unspecified: Secondary | ICD-10-CM

## 2018-12-07 ENCOUNTER — Encounter: Payer: Self-pay | Admitting: Family Medicine

## 2018-12-07 ENCOUNTER — Other Ambulatory Visit: Payer: Self-pay | Admitting: Family Medicine

## 2018-12-07 ENCOUNTER — Other Ambulatory Visit: Payer: Self-pay

## 2018-12-07 ENCOUNTER — Other Ambulatory Visit (INDEPENDENT_AMBULATORY_CARE_PROVIDER_SITE_OTHER): Payer: No Typology Code available for payment source

## 2018-12-07 DIAGNOSIS — O039 Complete or unspecified spontaneous abortion without complication: Secondary | ICD-10-CM

## 2018-12-07 LAB — HCG, QUANTITATIVE, PREGNANCY: Quantitative HCG: 185.16 m[IU]/mL

## 2018-12-07 NOTE — Addendum Note (Signed)
Addended by: Caffie Pinto on: 12/07/2018 04:02 PM   Modules accepted: Orders

## 2018-12-07 NOTE — Telephone Encounter (Signed)
Can you let me know so I can let the lab know.

## 2018-12-07 NOTE — Addendum Note (Signed)
Addended by: Sharon Seller B on: 12/07/2018 02:37 PM   Modules accepted: Orders

## 2018-12-08 LAB — PROGESTERONE: Progesterone: 34 ng/mL

## 2018-12-10 ENCOUNTER — Other Ambulatory Visit: Payer: Self-pay

## 2018-12-10 ENCOUNTER — Other Ambulatory Visit (INDEPENDENT_AMBULATORY_CARE_PROVIDER_SITE_OTHER): Payer: No Typology Code available for payment source

## 2018-12-10 DIAGNOSIS — O039 Complete or unspecified spontaneous abortion without complication: Secondary | ICD-10-CM | POA: Diagnosis not present

## 2018-12-10 LAB — HCG, QUANTITATIVE, PREGNANCY: Quantitative HCG: 1184.63 m[IU]/mL

## 2018-12-12 ENCOUNTER — Other Ambulatory Visit: Payer: No Typology Code available for payment source

## 2018-12-26 ENCOUNTER — Other Ambulatory Visit: Payer: Self-pay

## 2018-12-27 ENCOUNTER — Ambulatory Visit: Payer: 59 | Admitting: Obstetrics & Gynecology

## 2018-12-27 ENCOUNTER — Ambulatory Visit (INDEPENDENT_AMBULATORY_CARE_PROVIDER_SITE_OTHER): Payer: 59

## 2018-12-27 DIAGNOSIS — D259 Leiomyoma of uterus, unspecified: Secondary | ICD-10-CM

## 2018-12-27 DIAGNOSIS — Z369 Encounter for antenatal screening, unspecified: Secondary | ICD-10-CM

## 2018-12-27 DIAGNOSIS — Z3200 Encounter for pregnancy test, result unknown: Secondary | ICD-10-CM

## 2018-12-27 DIAGNOSIS — O3680X1 Pregnancy with inconclusive fetal viability, fetus 1: Secondary | ICD-10-CM

## 2018-12-27 DIAGNOSIS — Z3A01 Less than 8 weeks gestation of pregnancy: Secondary | ICD-10-CM | POA: Diagnosis not present

## 2018-12-27 DIAGNOSIS — N854 Malposition of uterus: Secondary | ICD-10-CM

## 2018-12-27 DIAGNOSIS — N8311 Corpus luteum cyst of right ovary: Secondary | ICD-10-CM | POA: Diagnosis not present

## 2018-12-27 DIAGNOSIS — O09299 Supervision of pregnancy with other poor reproductive or obstetric history, unspecified trimester: Secondary | ICD-10-CM

## 2018-12-27 NOTE — Progress Notes (Signed)
    Diana Mccormick 29-Jun-1985 [id number]        33 y.o.  G3P0020 Married.  LMP 11/10/2018 at 6 wks 5/7 days.  RP: First trimester pregnancy for dating/viability Ob US  HPI: Patient had a first trimester spontaneous abortion in July 2020.  Last menstrual period November 10, 2018.  No vaginal bleeding since then.  No pelvic pain.  Taking her prenatal vitamins.  Walking regularly.  Asthma stable on treatment.   OB History  Gravida Para Term Preterm AB Living  3       2 0  SAB TAB Ectopic Multiple Live Births    2          # Outcome Date GA Lbr Len/2nd Weight Sex Delivery Anes PTL Lv  3 Current           2 TAB           1 TAB             Past medical history,surgical history, problem list, medications, allergies, family history and social history were all reviewed and documented in the EPIC chart.   Directed ROS with pertinent positives and negatives documented in the history of present illness/assessment and plan.  Exam:  There were no vitals filed for this visit. General appearance:  Normal  Ob US today: Transvaginal images.  Anteverted uterus with several large fibroids.  Largest fibroids measured at 6.09 cm in diameter.  Single viable intrauterine line pregnancy.  Last menstrual period  November 10, 2018.  Dating corresponds between LMP at 58 5/7 wks and ultrasound dating 6 6/7 based on CRL at 0.9 cm.  Normal yolk sac.  Fetal heart rate 117/min.  Cervix is long and closed.  Adnexa normal with a 2.5 cm resolving corpus luteum cyst on the right ovary.  No free fluid in the posterior to the sac.   Assessment/Plan:  33 y.o. G3P0020   1. Encounter for confirmation of pregnancy test result with physical examination Single viable intrauterine pregnancy at 6 weeks and 5 days with fetal heart rate at 117/min.  Mp/ultrasound dating corresponds.  OB ultrasound findings reviewed with patient.  We will continue prenatal vitamins.  First trimester precautions with history of 1 spontaneous abortion  discussed.  Will establish OB care now.  2. H/O spontaneous abortion, currently pregnant Single viable intrauterine pregnancy confirmed by ultrasound today.  Counseling on above issues and coordination of care more than 50% for 15 minutes.  [name] MD, 9:33 AM 12/27/2018

## 2018-12-30 ENCOUNTER — Encounter: Payer: Self-pay | Admitting: Obstetrics & Gynecology

## 2018-12-30 NOTE — Patient Instructions (Signed)
1. Encounter for confirmation of pregnancy test result with physical examination Single viable intrauterine pregnancy at 6 weeks and 5 days with fetal heart rate at 117/min.  Mp/ultrasound dating corresponds.  OB ultrasound findings reviewed with patient.  We will continue prenatal vitamins.  First trimester precautions with history of 1 spontaneous abortion discussed.  Will establish OB care now.  2. H/O spontaneous abortion, currently pregnant Single viable intrauterine pregnancy confirmed by ultrasound today.  Diana Mccormick, it was a pleasure seeing you today!  Congratulations!

## 2019-01-21 LAB — OB RESULTS CONSOLE GC/CHLAMYDIA
Chlamydia: NEGATIVE
Gonorrhea: NEGATIVE

## 2019-01-21 LAB — OB RESULTS CONSOLE HEPATITIS B SURFACE ANTIGEN: Hepatitis B Surface Ag: NEGATIVE

## 2019-01-21 LAB — OB RESULTS CONSOLE ABO/RH: RH Type: POSITIVE

## 2019-01-21 LAB — OB RESULTS CONSOLE RUBELLA ANTIBODY, IGM: Rubella: IMMUNE

## 2019-01-21 LAB — OB RESULTS CONSOLE HIV ANTIBODY (ROUTINE TESTING): HIV: NONREACTIVE

## 2019-01-21 LAB — OB RESULTS CONSOLE RPR: RPR: NONREACTIVE

## 2019-01-21 LAB — OB RESULTS CONSOLE ANTIBODY SCREEN: Antibody Screen: NEGATIVE

## 2019-03-30 ENCOUNTER — Inpatient Hospital Stay (HOSPITAL_BASED_OUTPATIENT_CLINIC_OR_DEPARTMENT_OTHER): Payer: 59

## 2019-03-30 ENCOUNTER — Inpatient Hospital Stay (HOSPITAL_COMMUNITY)
Admission: AD | Admit: 2019-03-30 | Discharge: 2019-03-30 | Disposition: A | Discharge status: Home / Self Care | Payer: 59 | Attending: Obstetrics and Gynecology | Admitting: Obstetrics and Gynecology

## 2019-03-30 ENCOUNTER — Inpatient Hospital Stay (HOSPITAL_COMMUNITY): Payer: 59

## 2019-03-30 ENCOUNTER — Encounter (HOSPITAL_COMMUNITY): Payer: Self-pay

## 2019-03-30 ENCOUNTER — Inpatient Hospital Stay (HOSPITAL_COMMUNITY): Payer: No Typology Code available for payment source

## 2019-03-30 ENCOUNTER — Encounter (HOSPITAL_COMMUNITY): Payer: Self-pay | Admitting: Obstetrics and Gynecology

## 2019-03-30 ENCOUNTER — Other Ambulatory Visit: Payer: Self-pay

## 2019-03-30 DIAGNOSIS — R1031 Right lower quadrant pain: Secondary | ICD-10-CM | POA: Diagnosis present

## 2019-03-30 DIAGNOSIS — O09292 Supervision of pregnancy with other poor reproductive or obstetric history, second trimester: Secondary | ICD-10-CM | POA: Diagnosis not present

## 2019-03-30 DIAGNOSIS — Z8249 Family history of ischemic heart disease and other diseases of the circulatory system: Secondary | ICD-10-CM | POA: Insufficient documentation

## 2019-03-30 DIAGNOSIS — Z7951 Long term (current) use of inhaled steroids: Secondary | ICD-10-CM | POA: Diagnosis not present

## 2019-03-30 DIAGNOSIS — D259 Leiomyoma of uterus, unspecified: Secondary | ICD-10-CM | POA: Diagnosis not present

## 2019-03-30 DIAGNOSIS — Z79899 Other long term (current) drug therapy: Secondary | ICD-10-CM | POA: Insufficient documentation

## 2019-03-30 DIAGNOSIS — J45909 Unspecified asthma, uncomplicated: Secondary | ICD-10-CM | POA: Insufficient documentation

## 2019-03-30 DIAGNOSIS — O3412 Maternal care for benign tumor of corpus uteri, second trimester: Secondary | ICD-10-CM

## 2019-03-30 DIAGNOSIS — Z87891 Personal history of nicotine dependence: Secondary | ICD-10-CM | POA: Diagnosis not present

## 2019-03-30 DIAGNOSIS — Z3A20 20 weeks gestation of pregnancy: Secondary | ICD-10-CM

## 2019-03-30 DIAGNOSIS — O99512 Diseases of the respiratory system complicating pregnancy, second trimester: Secondary | ICD-10-CM | POA: Insufficient documentation

## 2019-03-30 DIAGNOSIS — O26892 Other specified pregnancy related conditions, second trimester: Secondary | ICD-10-CM | POA: Diagnosis not present

## 2019-03-30 DIAGNOSIS — O99212 Obesity complicating pregnancy, second trimester: Secondary | ICD-10-CM | POA: Diagnosis not present

## 2019-03-30 LAB — CBC WITH DIFFERENTIAL/PLATELET
Abs Immature Granulocytes: 0.03 10*3/uL (ref 0.00–0.07)
Basophils Absolute: 0 10*3/uL (ref 0.0–0.1)
Basophils Relative: 0 %
Eosinophils Absolute: 0.1 10*3/uL (ref 0.0–0.5)
Eosinophils Relative: 2 %
HCT: 33.3 % — ABNORMAL LOW (ref 36.0–46.0)
Hemoglobin: 11.2 g/dL — ABNORMAL LOW (ref 12.0–15.0)
Immature Granulocytes: 0 %
Lymphocytes Relative: 21 %
Lymphs Abs: 1.6 10*3/uL (ref 0.7–4.0)
MCH: 28.9 pg (ref 26.0–34.0)
MCHC: 33.6 g/dL (ref 30.0–36.0)
MCV: 85.8 fL (ref 80.0–100.0)
Monocytes Absolute: 0.5 10*3/uL (ref 0.1–1.0)
Monocytes Relative: 6 %
Neutro Abs: 5.3 10*3/uL (ref 1.7–7.7)
Neutrophils Relative %: 71 %
Platelets: 200 10*3/uL (ref 150–400)
RBC: 3.88 MIL/uL (ref 3.87–5.11)
RDW: 13.1 % (ref 11.5–15.5)
WBC: 7.5 10*3/uL (ref 4.0–10.5)
nRBC: 0 % (ref 0.0–0.2)

## 2019-03-30 LAB — WET PREP, GENITAL
Clue Cells Wet Prep HPF POC: NONE SEEN
Sperm: NONE SEEN
Trich, Wet Prep: NONE SEEN
Yeast Wet Prep HPF POC: NONE SEEN

## 2019-03-30 LAB — COMPREHENSIVE METABOLIC PANEL
ALT: 29 U/L (ref 0–44)
AST: 22 U/L (ref 15–41)
Albumin: 2.9 g/dL — ABNORMAL LOW (ref 3.5–5.0)
Alkaline Phosphatase: 38 U/L (ref 38–126)
Anion gap: 7 (ref 5–15)
BUN: 6 mg/dL (ref 6–20)
CO2: 22 mmol/L (ref 22–32)
Calcium: 9 mg/dL (ref 8.9–10.3)
Chloride: 108 mmol/L (ref 98–111)
Creatinine, Ser: 0.69 mg/dL (ref 0.44–1.00)
GFR calc Af Amer: >60 mL/min (ref >60)
GFR calc non Af Amer: >60 mL/min (ref >60)
Glucose, Bld: 77 mg/dL (ref 70–99)
Potassium: 3.9 mmol/L (ref 3.5–5.1)
Sodium: 137 mmol/L (ref 135–145)
Total Bilirubin: 0.2 mg/dL — ABNORMAL LOW (ref 0.3–1.2)
Total Protein: 6.4 g/dL — ABNORMAL LOW (ref 6.5–8.1)

## 2019-03-30 LAB — URINALYSIS, ROUTINE W REFLEX MICROSCOPIC
Bilirubin Urine: NEGATIVE
Glucose, UA: 50 mg/dL — AB
Hgb urine dipstick: NEGATIVE
Ketones, ur: NEGATIVE mg/dL
Leukocytes,Ua: NEGATIVE
Nitrite: NEGATIVE
Protein, ur: NEGATIVE mg/dL
Specific Gravity, Urine: 1.016 (ref 1.005–1.030)
pH: 5 (ref 5.0–8.0)

## 2019-03-30 MED ORDER — ACETAMINOPHEN 500 MG PO TABS
1000.0000 mg | ORAL_TABLET | Freq: Once | ORAL | Status: AC
  Administered 2019-03-30: 16:00:00 1000 mg via ORAL
  Filled 2019-03-30: qty 2

## 2019-03-30 NOTE — MAU Provider Note (Signed)
History     CSN: [id number]  Arrival date and time: 03/30/19 1248   First Provider Initiated Contact with Patient 03/30/19 1433      Chief Complaint  Patient presents with  . Abdominal Pain  . Back Pain  . Dysuria   Ms. Diana Mccormick is a 33 y.o. G4P0030 at [redacted]w[redacted]d who presents to MAU for RLQ abdominal pain. Pt reports last intercourse about one week ago.  Onset: 10PM last night Location: RLQ Duration: <24hrs Character: burning sensation currently, cramps last night, radiating into legs and wrapping around to back and into vagina and rectum, pt also experiencing pressure with "light pain" that sometimes is a sharp/crampy pain Aggravating/Associated: sitting, walking, staying in one position for too long/urination x3 in one hour prior to coming to MAU with minor dysuria Relieving: right-sided lying, sleeping Treatment: none Severity: 4/10  Pt denies VB, LOF, ctx, vaginal discharge/odor/itching. Pt denies N/V, constipation, diarrhea. Pt denies fever, chills, fatigue, sweating or changes in appetite. Pt denies SOB or chest pain. Pt denies dizziness, HA, light-headedness, weakness.  Problems this pregnancy include: fibroids x3-4. Allergies? Bactrim, sulfur Current medications/supplements? PNVs, albuterol, symbicort Prenatal care provider? [facility], next appt 04/09/2019  Pt's husband present for entire visit.   OB History    Gravida  4   Para      Term      Preterm      AB  3   Living  0     SAB  1   TAB  2   Ectopic      Multiple      Live Births              Past Medical History:  Diagnosis Date  . Asthma   . Fibroid   . Migraine     History reviewed. No pertinent surgical history.  Family History  Problem Relation Age of Onset  . Hypertension Other   . Hypertension Maternal Aunt   . Cancer Maternal Grandmother        cervical    Social History   Tobacco Use  . Smoking status: Former Smoker    Quit date: 10/03/2018    Years  since quitting: 0.4  . Smokeless tobacco: Never Used  Substance Use Topics  . Alcohol use: Not Currently    Alcohol/week: 0.0 standard drinks  . Drug use: No    Allergies:  Allergies  Allergen Reactions  . Bactrim [Sulfamethoxazole-Trimethoprim] Anaphylaxis    Hypotension and Fever-like symptoms  . Sulfur Anaphylaxis    Low heart rate    Medications Prior to Admission  Medication Sig Dispense Refill Last Dose  . albuterol (PROVENTIL) (2.5 MG/3ML) 0.083% nebulizer solution Take 3 mLs (2.5 mg total) by nebulization every 4 (four) hours as needed for wheezing or shortness of breath. 75 mL 1   . albuterol (VENTOLIN HFA) 108 (90 Base) MCG/ACT inhaler Inhale into the lungs as directed.     . budesonide-formoterol (SYMBICORT) 160-4.5 MCG/ACT inhaler Inhale 2 puffs into the lungs 2 (two) times daily. 1 Inhaler 3   . naproxen sodium (ALEVE) 220 MG tablet Take 220 mg by mouth.       Review of Systems  Constitutional: Negative for chills, diaphoresis, fatigue and fever.  Eyes: Negative for visual disturbance.  Respiratory: Negative for shortness of breath.   Cardiovascular: Negative for chest pain.  Gastrointestinal: Positive for abdominal pain. Negative for constipation, diarrhea, nausea and vomiting.  Genitourinary: Negative for dysuria, flank pain, frequency, pelvic pain, urgency,  vaginal bleeding and vaginal discharge.  Neurological: Negative for dizziness, weakness, light-headedness and headaches.   Physical Exam   Blood pressure (!) 121/58, pulse 82, temperature 98.1 F (36.7 C), temperature source Oral, resp. rate 17, height 5\' 6"  (1.676 m), weight 97.2 kg, last menstrual period 10/07/2018, SpO2 99 %, unknown if currently breastfeeding.  Patient Vitals for the past 24 hrs:  BP Temp Temp src Pulse Resp SpO2 Height Weight  03/30/19 1312 (!) 121/58 98.1 F (36.7 C) Oral 82 17 99 % -- --  03/30/19 1304 -- -- -- -- -- -- 5\' 6"  (1.676 m) 97.2 kg   Physical Exam  Constitutional:  She is oriented to person, place, and time. She appears well-developed and well-nourished. No distress.  HENT:  Head: Normocephalic and atraumatic.  Respiratory: Effort normal.  GI: Soft. She exhibits no distension and no mass. There is abdominal tenderness in the right lower quadrant. There is no rebound and no guarding.  Genitourinary:    No vaginal discharge.   Neurological: She is alert and oriented to person, place, and time.  Skin: Skin is warm and dry. She is not diaphoretic.  Psychiatric: She has a normal mood and affect. Her behavior is normal. Judgment and thought content normal.  FHR 145  Results for orders placed or performed during the hospital encounter of 03/30/19 (from the past 24 hour(s))  Urinalysis, Routine w reflex microscopic     Status: Abnormal   Collection Time: 03/30/19  1:42 PM  Result Value Ref Range   Color, Urine YELLOW YELLOW   APPearance CLEAR CLEAR   Specific Gravity, Urine 1.016 1.005 - 1.030   pH 5.0 5.0 - 8.0   Glucose, UA 50 (A) NEGATIVE mg/dL   Hgb urine dipstick NEGATIVE NEGATIVE   Bilirubin Urine NEGATIVE NEGATIVE   Ketones, ur NEGATIVE NEGATIVE mg/dL   Protein, ur NEGATIVE NEGATIVE mg/dL   Nitrite NEGATIVE NEGATIVE   Leukocytes,Ua NEGATIVE NEGATIVE  Wet prep, genital     Status: Abnormal   Collection Time: 03/30/19  3:03 PM  Result Value Ref Range   Yeast Wet Prep HPF POC NONE SEEN NONE SEEN   Trich, Wet Prep NONE SEEN NONE SEEN   Clue Cells Wet Prep HPF POC NONE SEEN NONE SEEN   WBC, Wet Prep HPF POC MANY (A) NONE SEEN   Sperm NONE SEEN   CBC with Differential/Platelet     Status: Abnormal   Collection Time: 03/30/19  3:27 PM  Result Value Ref Range   WBC 7.5 4.0 - 10.5 K/uL   RBC 3.88 3.87 - 5.11 MIL/uL   Hemoglobin 11.2 (L) 12.0 - 15.0 g/dL   HCT 33.3 (L) 36.0 - 46.0 %   MCV 85.8 80.0 - 100.0 fL   MCH 28.9 26.0 - 34.0 pg   MCHC 33.6 30.0 - 36.0 g/dL   RDW 13.1 11.5 - 15.5 %   Platelets 200 150 - 400 K/uL   nRBC 0.0 0.0 - 0.2 %    Neutrophils Relative % 71 %   Neutro Abs 5.3 1.7 - 7.7 K/uL   Lymphocytes Relative 21 %   Lymphs Abs 1.6 0.7 - 4.0 K/uL   Monocytes Relative 6 %   Monocytes Absolute 0.5 0.1 - 1.0 K/uL   Eosinophils Relative 2 %   Eosinophils Absolute 0.1 0.0 - 0.5 K/uL   Basophils Relative 0 %   Basophils Absolute 0.0 0.0 - 0.1 K/uL   Immature Granulocytes 0 %   Abs Immature Granulocytes 0.03 0.00 -  0.07 K/uL  Comprehensive metabolic panel     Status: Abnormal   Collection Time: 03/30/19  3:27 PM  Result Value Ref Range   Sodium 137 135 - 145 mmol/L   Potassium 3.9 3.5 - 5.1 mmol/L   Chloride 108 98 - 111 mmol/L   CO2 22 22 - 32 mmol/L   Glucose, Bld 77 70 - 99 mg/dL   BUN 6 6 - 20 mg/dL   Creatinine, Ser 0.69 0.44 - 1.00 mg/dL   Calcium 9.0 8.9 - 10.3 mg/dL   Total Protein 6.4 (L) 6.5 - 8.1 g/dL   Albumin 2.9 (L) 3.5 - 5.0 g/dL   AST 22 15 - 41 U/L   ALT 29 0 - 44 U/L   Alkaline Phosphatase 38 38 - 126 U/L   Total Bilirubin 0.2 (L) 0.3 - 1.2 mg/dL   GFR calc non Af Amer >60 >60 mL/min   GFR calc Af Amer >60 >60 mL/min   Anion gap 7 5 - 15   US Abdomen Limited  Result Date: 03/30/2019 CLINICAL DATA:  Right lower quadrant abdominal pain, pregnant EXAM: ULTRASOUND ABDOMEN LIMITED TECHNIQUE: Pearline Cables scale imaging of the right lower quadrant was performed to evaluate for suspected appendicitis. Standard imaging planes and graded compression technique were utilized. COMPARISON:  None. FINDINGS: The appendix is not visualized. Ancillary findings: Multiple partially exophytic fibroids are seen. The largest measuring 6.7 x 5.2 in the posterior fundus. The right ovary is normal in size and appearance, however somewhat limited evaluation due to pregnancy. Factors affecting image quality: Pregnancy. Other findings: None. IMPRESSION: Partially visualized uterine fibroids as described above. Normal appearing right ovary. Electronically Signed   By: Prudencio Pair M.D.   On: 03/30/2019 17:11    MAU Course   Procedures  MDM -RLQ abdominal pain, radiating to legs/back/vagina with hx of fibroids -temp 98.1 -pt endorses dysuria -UA: 50GLU, otherwise WNL, urine sent for culture based on symptoms -CBC w/Diff: WNL -CMP: no abnormalities requiring treatment -WetPrep: WNL -GC/CT collected -RLQ Abdominal US: appendix not visualized, otherwise WNL except fibroids -Limited OB US: left lateral placenta, AFI WNL, CL 3.5cm, fibroids x3 (6cm, 8cm, 4cm) -pt initially declines pain medication, then accepts Tylenol -1000mg  Tylenol given, pt reports pain now 1/10, pt requesting food in MAU -pt discharged to home in stable condition  Orders Placed This Encounter  Procedures  . Wet prep, genital    Standing Status:   Standing    Number of Occurrences:   1  . Culture, OB Urine    Standing Status:   Standing    Number of Occurrences:   1  . [place] MFM OB LIMITED    Please measure cervical length in addition to limited US.    Standing Status:   Standing    Number of Occurrences:   1    Order Specific Question:   Symptom/Reason for Exam    Answer:   RLQ abdominal pain [id number]  . [place] MFM OB Transvaginal    Standing Status:   Standing    Number of Occurrences:   1    Order Specific Question:   Symptom/Reason for Exam    Answer:   RLQ abdominal pain [id number]  . US Abdomen Limited    Standing Status:   Standing    Number of Occurrences:   1    Order Specific Question:   Symptom/Reason for Exam    Answer:   RLQ abdominal pain [id number]  . Urinalysis, Routine w reflex microscopic  Standing Status:   Standing    Number of Occurrences:   1  . CBC with Differential/Platelet    Standing Status:   Standing    Number of Occurrences:   1  . Comprehensive metabolic panel    Standing Status:   Standing    Number of Occurrences:   1  . Discharge patient    Order Specific Question:   Discharge disposition    Answer:   01-Home or Self Care [1]    Order Specific Question:   Discharge patient date    Answer:    03/30/2019   Meds ordered this encounter  Medications  . acetaminophen (TYLENOL) tablet 1,000 mg   Assessment and Plan   1. Leiomyoma of uterus affecting pregnancy in second trimester   2. RLQ abdominal pain   3. [redacted] weeks gestation of pregnancy    Allergies as of 03/30/2019      Reactions   Bactrim [sulfamethoxazole-trimethoprim] Anaphylaxis   Hypotension and Fever-like symptoms   Sulfur Anaphylaxis   Low heart rate      Medication List    STOP taking these medications   naproxen sodium 220 MG tablet Commonly known as: ALEVE     TAKE these medications   albuterol 108 (90 Base) MCG/ACT inhaler Commonly known as: VENTOLIN HFA Inhale into the lungs as directed.   albuterol (2.5 MG/3ML) 0.083% nebulizer solution Commonly known as: PROVENTIL Take 3 mLs (2.5 mg total) by nebulization every 4 (four) hours as needed for wheezing or shortness of breath.   budesonide-formoterol 160-4.5 MCG/ACT inhaler Commonly known as: SYMBICORT Inhale 2 puffs into the lungs 2 (two) times daily.      -will call with culture results, if positive -fibroids approximately doubled in size since scan 11/2018, pt advised they will likely get larger and cause increased pain and put her at risk for PTD -pt advised to return to MAU if uncontrolled pain -discussed s/sx of PTD -return MAU precautions given -pt discharged to home in stable condition  Elmyra Ricks E Ricco Dershem 03/30/2019, 5:45 PM

## 2019-03-30 NOTE — MAU Note (Signed)
JURY VANDERSTEEN is a 33 y.o. at [redacted]w[redacted]d here in MAU reporting: last night around 2200 with severe right abdominal pain that radiated to her back and leg. Walking helped ease the pain a little bit. Around 0400 the pain resolved when she was laying on the right side. Now is having back pain and lower abdominal pain that goes into her groin/rectum. When sitting she feels a lot of pressure. No vaginal bleeding, discharge, or LOF. Denies burning with urination but did have 1 episode of dysuria.   Onset of complaint: last night  Pain score: abdominal pain 2/10, back pain 1/10  Vitals:   03/30/19 1312  BP: (!) 121/58  Pulse: 82  Resp: 17  Temp: 98.1 F (36.7 C)  SpO2: 99%     FHT:145  Lab orders placed from triage: [facility]A

## 2019-03-30 NOTE — Discharge Instructions (Signed)
Safe Medications in Pregnancy    Acne: Benzoyl Peroxide Salicylic Acid  Backache/Headache: Tylenol: 2 regular strength every 4 hours OR              2 Extra strength every 6 hours  Colds/Coughs/Allergies: Benadryl (alcohol free) 25 mg every 6 hours as needed Breath right strips Claritin Cepacol throat lozenges Chloraseptic throat spray Cold-Eeze- up to three times per day Cough drops, alcohol free Flonase (by prescription only) Guaifenesin Mucinex Robitussin DM (plain only, alcohol free) Saline nasal spray/drops Sudafed (pseudoephedrine) & Actifed ** use only after [redacted] weeks gestation and if you do not have high blood pressure Tylenol Vicks Vaporub Zinc lozenges Zyrtec   Constipation: Colace Ducolax suppositories Fleet enema Glycerin suppositories Metamucil Milk of magnesia Miralax Senokot Smooth move tea  Diarrhea: Kaopectate Imodium A-D  *NO pepto Bismol  Hemorrhoids: Anusol Anusol HC Preparation H Tucks  Indigestion: Tums Maalox Mylanta Zantac  Pepcid  Insomnia: Benadryl (alcohol free) 25mg  every 6 hours as needed Tylenol PM Unisom, no Gelcaps  Leg Cramps: Tums MagGel  Nausea/Vomiting:  Bonine Dramamine Emetrol Ginger extract Sea bands Meclizine  Nausea medication to take during pregnancy:  Unisom (doxylamine succinate 25 mg tablets) Take one tablet daily at bedtime. If symptoms are not adequately controlled, the dose can be increased to a maximum recommended dose of two tablets daily (1/2 tablet in the morning, 1/2 tablet mid-afternoon and one at bedtime). Vitamin B6 100mg  tablets. Take one tablet twice a day (up to 200 mg per day).  Skin Rashes: Aveeno products Benadryl cream or 25mg  every 6 hours as needed Calamine Lotion 1% cortisone cream  Yeast infection: Gyne-lotrimin 7 Monistat 7   **If taking multiple medications, please check labels to avoid duplicating the same active ingredients **take  medication as directed on the label ** Do not exceed 4000 mg of tylenol in 24 hours **Do not take medications that contain aspirin or ibuprofen        Abdominal Pain During Pregnancy  Abdominal pain is common during pregnancy, and has many possible causes. Some causes are more serious than others, and sometimes the cause is not known. Abdominal pain can be a sign that labor is starting. It can also be caused by normal growth and stretching of muscles and ligaments during pregnancy. Always tell your health care provider if you have any abdominal pain. Follow these instructions at home:  Do not have sex or put anything in your vagina until your pain goes away completely.  Get plenty of rest until your pain improves.  Drink enough fluid to keep your urine pale yellow.  Take over-the-counter and prescription medicines only as told by your health care provider.  Keep all follow-up visits as told by your health care provider. This is important. Contact a health care provider if:  Your pain continues or gets worse after resting.  You have lower abdominal pain that: ? Comes and goes at regular intervals. ? Spreads to your back. ? Is similar to menstrual cramps.  You have pain or burning when you urinate. Get help right away if:  You have a fever or chills.  You have vaginal bleeding.  You are leaking fluid from your vagina.  You are passing tissue from your vagina.  You have vomiting or diarrhea that lasts for more than 24 hours.  Your baby is moving less than usual.  You feel very weak or faint.  You have shortness of breath.  You develop severe pain in your upper abdomen. Summary  Abdominal pain is common during pregnancy, and has many possible causes.  If you experience abdominal pain during pregnancy, tell your health care provider right away.  Follow your health care provider's home care instructions and keep all follow-up visits as directed. This information  is not intended to replace advice given to you by your health care provider. Make sure you discuss any questions you have with your health care provider. Document Released: 03/28/2005 Document Revised: 07/16/2018 Document Reviewed: 06/30/2016 Elsevier Patient Education  2020 [employer].  Preterm Labor and Birth Information  The normal length of a pregnancy is 39-41 weeks. Preterm labor is when labor starts before 37 completed weeks of pregnancy. What are the risk factors for preterm labor? Preterm labor is more likely to occur in women who:  Have certain infections during pregnancy such as a bladder infection, sexually transmitted infection, or infection inside the uterus (chorioamnionitis).  Have a shorter-than-normal cervix.  Have gone into preterm labor before.  Have had surgery on their cervix.  Are younger than age 51 or older than age 11.  Are African American.  Are pregnant with twins or multiple babies (multiple gestation).  Take street drugs or smoke while pregnant.  Do not gain enough weight while pregnant.  Became pregnant shortly after having been pregnant. What are the symptoms of preterm labor? Symptoms of preterm labor include:  Cramps similar to those that can happen during a menstrual period. The cramps may happen with diarrhea.  Pain in the abdomen or lower back.  Regular uterine contractions that may feel like tightening of the abdomen.  A feeling of increased pressure in the pelvis.  Increased watery or bloody mucus discharge from the vagina.  Water breaking (ruptured amniotic sac). Why is it important to recognize signs of preterm labor? It is important to recognize signs of preterm labor because babies who are born prematurely may not be fully developed. This can put them at an increased risk for:  Long-term (chronic) heart and lung problems.  Difficulty immediately after birth with regulating body systems, including blood sugar, body  temperature, heart rate, and breathing rate.  Bleeding in the brain.  Cerebral palsy.  Learning difficulties.  Death. These risks are highest for babies who are born before 1 weeks of pregnancy. How is preterm labor treated? Treatment depends on the length of your pregnancy, your condition, and the health of your baby. It may involve:  Having a stitch (suture) placed in your cervix to prevent your cervix from opening too early (cerclage).  Taking or being given medicines, such as: ? Hormone medicines. These may be given early in pregnancy to help support the pregnancy. ? Medicine to stop contractions. ? Medicines to help mature the baby's lungs. These may be prescribed if the risk of delivery is high. ? Medicines to prevent your baby from developing cerebral palsy. If the labor happens before 34 weeks of pregnancy, you may need to stay in the hospital. What should I do if I think I am in preterm labor? If you think that you are going into preterm labor, call your health care provider right away. How can I prevent preterm labor in future pregnancies? To increase your chance of having a full-term pregnancy:  Do not use any tobacco products, such as cigarettes, chewing tobacco, and e-cigarettes. If you need help quitting, ask your health care provider.  Do not use street drugs or medicines that have not been prescribed to you during your pregnancy.  Talk with your health  care provider before taking any herbal supplements, even if you have been taking them regularly.  Make sure you gain a healthy amount of weight during your pregnancy.  Watch for infection. If you think that you might have an infection, get it checked right away.  Make sure to tell your health care provider if you have gone into preterm labor before. This information is not intended to replace advice given to you by your health care provider. Make sure you discuss any questions you have with your health care  provider. Document Released: 06/18/2003 Document Revised: 07/20/2018 Document Reviewed: 08/19/2015 Elsevier Patient Education  2020 [employer].  Uterine Fibroids  Uterine fibroids (leiomyomas) are noncancerous (benign) tumors that can develop in the uterus. Fibroids may also develop in the fallopian tubes, cervix, or tissues (ligaments) near the uterus. You may have one or many fibroids. Fibroids vary in size, weight, and where they grow in the uterus. Some can become quite large. Most fibroids do not require medical treatment. What are the causes? The cause of this condition is not known. What increases the risk? You are more likely to develop this condition if you:  Are in your 30s or 40s and have not gone through menopause.  Have a family history of this condition.  Are of African-American descent.  Had your first period at an early age (early menarche).  Have not had any children (nulliparity).  Are overweight or obese. What are the signs or symptoms? Many women do not have any symptoms. Symptoms of this condition may include:  Heavy menstrual bleeding.  Bleeding or spotting between periods.  Pain and pressure in the pelvic area, between the hips.  Bladder problems, such as needing to urinate urgently or more often than usual.  Inability to have children (infertility).  Failure to carry pregnancy to term (miscarriage). How is this diagnosed? This condition may be diagnosed based on:  Your symptoms and medical history.  A physical exam.  A pelvic exam that includes feeling for any tumors.  Imaging tests, such as ultrasound or MRI. How is this treated? Treatment for this condition may include:  Seeing your health care provider for follow-up visits to monitor your fibroids for any changes.  Taking NSAIDs such as ibuprofen, naproxen, or aspirin to reduce pain.  Hormone medicines. These may be taken as a pill, given in an injection, or delivered by a T-shaped  device that is inserted into the uterus (intrauterine device, IUD).  Surgery to remove one of the following: ? The fibroids (myomectomy). Your health care provider may recommend this if fibroids affect your fertility and you want to become pregnant. ? The uterus (hysterectomy). ? Blood supply to the fibroids (uterine artery embolization). Follow these instructions at home:  Take over-the-counter and prescription medicines only as told by your health care provider.  Ask your health care provider if you should take iron pills or eat more iron-rich foods, such as dark green, leafy vegetables. Heavy menstrual bleeding can cause low iron levels.  If directed, apply heat to your back or abdomen to reduce pain. Use the heat source that your health care provider recommends, such as a moist heat pack or a heating pad. ? Place a towel between your skin and the heat source. ? Leave the heat on for 20-30 minutes. ? Remove the heat if your skin turns bright red. This is especially important if you are unable to feel pain, heat, or cold. You may have a greater risk of getting burned.  Pay close attention to your menstrual cycle. Tell your health care provider about any changes, such as: ? Increased blood flow that requires you to use more pads or tampons than usual. ? A change in the number of days that your period lasts. ? A change in symptoms that are associated with your period, such as back pain or cramps in your abdomen.  Keep all follow-up visits as told by your health care provider. This is important, especially if your fibroids need to be monitored for any changes. Contact a health care provider if you:  Have pelvic pain, back pain, or cramps in your abdomen that do not get better with medicine or heat.  Develop new bleeding between periods.  Have increased bleeding during or between periods.  Feel unusually tired or weak.  Feel light-headed. Get help right away if you:  Faint.  Have  pelvic pain that suddenly gets worse.  Have severe vaginal bleeding that soaks a tampon or pad in 30 minutes or less. Summary  Uterine fibroids are noncancerous (benign) tumors that can develop in the uterus.  The exact cause of this condition is not known.  Most fibroids do not require medical treatment unless they affect your ability to have children (fertility).  Contact a health care provider if you have pelvic pain, back pain, or cramps in your abdomen that do not get better with medicines.  Make sure you know what symptoms should cause you to get help right away. This information is not intended to replace advice given to you by your health care provider. Make sure you discuss any questions you have with your health care provider. Document Released: 03/25/2000 Document Revised: 03/10/2017 Document Reviewed: 02/21/2017 Elsevier Patient Education  2020 [employer].

## 2019-03-31 LAB — CULTURE, OB URINE: Culture: <10000 — AB

## 2019-04-01 ENCOUNTER — Encounter: Payer: Self-pay | Admitting: Women's Health

## 2019-04-01 DIAGNOSIS — N856 Intrauterine synechiae: Secondary | ICD-10-CM | POA: Insufficient documentation

## 2019-04-01 LAB — GC/CHLAMYDIA PROBE AMP (~~LOC~~) NOT AT ARMC
Chlamydia: NEGATIVE
Comment: NEGATIVE
Comment: NORMAL
Neisseria Gonorrhea: NEGATIVE

## 2019-04-30 MED FILL — CYCLOBENZAPRINE HCL 5 MG TA: 5 | 10 days supply | Qty: 30 | Fill #0

## 2019-06-06 MED FILL — busPIRone HCL 5 MG TABS: 5 | 30 days supply | Qty: 60 | Fill #0

## 2019-06-24 ENCOUNTER — Encounter: Payer: Self-pay | Admitting: Family Medicine

## 2019-06-25 ENCOUNTER — Encounter: Payer: Self-pay | Admitting: Family Medicine

## 2019-06-25 ENCOUNTER — Other Ambulatory Visit: Payer: Self-pay

## 2019-06-25 ENCOUNTER — Ambulatory Visit (INDEPENDENT_AMBULATORY_CARE_PROVIDER_SITE_OTHER): Payer: 59 | Admitting: Family Medicine

## 2019-06-25 VITALS — BP 118/78 | HR 71 | Ht 66.0 in | Wt 227.0 lb

## 2019-06-25 DIAGNOSIS — J4540 Moderate persistent asthma, uncomplicated: Secondary | ICD-10-CM

## 2019-06-25 MED ORDER — BUDESONIDE-FORMOTEROL FUMARATE 160-4.5 MCG/ACT IN AERO: 2.0000 | INHALATION_SPRAY | Freq: Two times a day (BID) | RESPIRATORY_TRACT | 6 refills | Status: AC

## 2019-06-25 MED ORDER — MONTELUKAST SODIUM 10 MG PO TABS: 10.0000 mg | ORAL_TABLET | Freq: Every day | ORAL | 6 refills | Status: AC

## 2019-06-25 MED FILL — SYMBICORT 160-4.5 MCG INH: 160-4.5 | 30 days supply | Qty: 10 | Fill #0

## 2019-06-25 MED FILL — MONTELUKAST SOD 10 MG TAB: 10 | 30 days supply | Qty: 30 | Fill #0

## 2019-06-25 NOTE — Progress Notes (Signed)
Chief Complaint  Patient presents with  . Asthma    Subjective: Patient is a 34 y.o. female here for f/u asthma. Due to COVID-19 pandemic, we are interacting via web portal for an electronic face-to-face visit. I verified patient's ID using 2 identifiers. Patient agreed to proceed with visit via this method. Patient is in a parked car, I am at office. Patient and I are present for visit.   Pt w hx of asthma, currently on Symbicort 160-4.5 mcg bid, reports compliance w/o AE's. She is [redacted] weeks pregnant, pregnanyc has worsened s/s's. Using albuterol near daily. Not on any other meds. Start Claritin for allergies. Would like letter allowing her to work from home as mask wearing makes her breathing worse.    ROS: Heart: Denies chest pain  Lungs: +more freq sob/cough   Past Medical History:  Diagnosis Date  . Asthma   . Fibroid   . Migraine     Objective: BP 118/78 (BP Location: Left Arm, Patient Position: Sitting, Cuff Size: Normal)   Pulse 71   Ht 5\' 6"  (1.676 m)   Wt 227 lb (103 kg)   LMP 10/07/2018   BMI 36.64 kg/m  No conversational dyspnea Age appropriate judgment and insight Nml affect and mood  Assessment and Plan: Moderate persistent asthma without complication - Plan: budesonide-formoterol (SYMBICORT) 160-4.5 MCG/ACT inhaler, montelukast (SINGULAIR) 10 MG tablet  Cont Symbicort, prn SABA. Add montelukast.  Letter for work sent via [employer] F/u in 6 mo for CPE or prn. The patient voiced understanding and agreement to the plan.  [place], DO 06/25/19  9:21 AM

## 2019-07-05 ENCOUNTER — Encounter (HOSPITAL_COMMUNITY): Payer: Self-pay | Admitting: Obstetrics

## 2019-07-05 ENCOUNTER — Other Ambulatory Visit: Payer: Self-pay

## 2019-07-05 ENCOUNTER — Inpatient Hospital Stay (HOSPITAL_COMMUNITY)
Admission: AD | Admit: 2019-07-05 | Discharge: 2019-07-05 | Disposition: A | Discharge status: Home / Self Care | Payer: 59 | Attending: Obstetrics | Admitting: Obstetrics

## 2019-07-05 DIAGNOSIS — M542 Cervicalgia: Secondary | ICD-10-CM | POA: Diagnosis not present

## 2019-07-05 DIAGNOSIS — N856 Intrauterine synechiae: Secondary | ICD-10-CM | POA: Insufficient documentation

## 2019-07-05 DIAGNOSIS — M545 Low back pain, unspecified: Secondary | ICD-10-CM

## 2019-07-05 DIAGNOSIS — Z79899 Other long term (current) drug therapy: Secondary | ICD-10-CM | POA: Diagnosis not present

## 2019-07-05 DIAGNOSIS — O9A213 Injury, poisoning and certain other consequences of external causes complicating pregnancy, third trimester: Secondary | ICD-10-CM | POA: Diagnosis not present

## 2019-07-05 DIAGNOSIS — O99513 Diseases of the respiratory system complicating pregnancy, third trimester: Secondary | ICD-10-CM | POA: Insufficient documentation

## 2019-07-05 DIAGNOSIS — Y929 Unspecified place or not applicable: Secondary | ICD-10-CM | POA: Insufficient documentation

## 2019-07-05 DIAGNOSIS — R109 Unspecified abdominal pain: Secondary | ICD-10-CM | POA: Diagnosis not present

## 2019-07-05 DIAGNOSIS — Z3A33 33 weeks gestation of pregnancy: Secondary | ICD-10-CM | POA: Diagnosis not present

## 2019-07-05 DIAGNOSIS — Z3689 Encounter for other specified antenatal screening: Secondary | ICD-10-CM

## 2019-07-05 DIAGNOSIS — J45909 Unspecified asthma, uncomplicated: Secondary | ICD-10-CM | POA: Diagnosis not present

## 2019-07-05 DIAGNOSIS — Y939 Activity, unspecified: Secondary | ICD-10-CM | POA: Diagnosis not present

## 2019-07-05 DIAGNOSIS — Y999 Unspecified external cause status: Secondary | ICD-10-CM | POA: Diagnosis not present

## 2019-07-05 DIAGNOSIS — Z87891 Personal history of nicotine dependence: Secondary | ICD-10-CM | POA: Insufficient documentation

## 2019-07-05 DIAGNOSIS — O26893 Other specified pregnancy related conditions, third trimester: Secondary | ICD-10-CM | POA: Insufficient documentation

## 2019-07-05 HISTORY — DX: Anxiety disorder, unspecified: F41.9

## 2019-07-05 LAB — URINALYSIS, ROUTINE W REFLEX MICROSCOPIC
Bilirubin Urine: NEGATIVE
Glucose, UA: NEGATIVE mg/dL
Hgb urine dipstick: NEGATIVE
Ketones, ur: 5 mg/dL — AB
Leukocytes,Ua: NEGATIVE
Nitrite: NEGATIVE
Protein, ur: NEGATIVE mg/dL
Specific Gravity, Urine: 1.003 — ABNORMAL LOW (ref 1.005–1.030)
pH: 6 (ref 5.0–8.0)

## 2019-07-05 MED ORDER — CYCLOBENZAPRINE HCL 10 MG PO TABS: 10.0000 mg | ORAL_TABLET | Freq: Three times a day (TID) | ORAL | 0 refills | Status: DC | PRN

## 2019-07-05 MED ORDER — ACETAMINOPHEN 500 MG PO TABS
1000.0000 mg | ORAL_TABLET | Freq: Four times a day (QID) | ORAL | Status: DC | PRN
  Administered 2019-07-05: 14:00:00 1000 mg via ORAL
  Filled 2019-07-05 (×2): qty 2

## 2019-07-05 MED FILL — CYCLOBENZAPRINE HCL 10 MG T: 10 | 10 days supply | Qty: 30 | Fill #0
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
## 2019-07-05 NOTE — Discharge Instructions (Signed)
Back Pain in Pregnancy Back pain during pregnancy is common. Back pain may be caused by several factors that are related to changes during your pregnancy. Follow these instructions at home: Managing pain, stiffness, and swelling      If directed, for sudden (acute) back pain, put ice on the painful area. ? Put ice in a plastic bag. ? Place a towel between your skin and the bag. ? Leave the ice on for 20 minutes, 2-3 times per day.  If directed, apply heat to the affected area before you exercise. Use the heat source that your health care provider recommends, such as a moist heat pack or a heating pad. ? Place a towel between your skin and the heat source. ? Leave the heat on for 20-30 minutes. ? Remove the heat if your skin turns bright red. This is especially important if you are unable to feel pain, heat, or cold. You may have a greater risk of getting burned.  If directed, massage the affected area. Activity  Exercise as told by your health care provider. Gentle exercise is the best way to prevent or manage back pain.  Listen to your body when lifting. If lifting hurts, ask for help or bend your knees. This uses your leg muscles instead of your back muscles.  Squat down when picking up something from the floor. Do not bend over.  Only use bed rest for short periods as told by your health care provider. Bed rest should only be used for the most severe episodes of back pain. Standing, sitting, and lying down  Do not stand in one place for long periods of time.  Use good posture when sitting. Make sure your head rests over your shoulders and is not hanging forward. Use a pillow on your lower back if necessary.  Try sleeping on your side, preferably the left side, with a pregnancy support pillow or 1-2 regular pillows between your legs. ? If you have back pain after a night's rest, your bed may be too soft. ? A firm mattress may provide more support for your back during  pregnancy. General instructions  Do not wear high heels.  Eat a healthy diet. Try to gain weight within your health care provider's recommendations.  Use a maternity girdle, elastic sling, or back brace as told by your health care provider.  Take over-the-counter and prescription medicines only as told by your health care provider.  Work with a physical therapist or massage therapist to find ways to manage back pain. Acupuncture or massage therapy may be helpful.  Keep all follow-up visits as told by your health care provider. This is important. Contact a health care provider if:  Your back pain interferes with your daily activities.  You have increasing pain in other parts of your body. Get help right away if:  You develop numbness, tingling, weakness, or problems with the use of your arms or legs.  You develop severe back pain that is not controlled with medicine.  You have a change in bowel or bladder control.  You develop shortness of breath, dizziness, or you faint.  You develop nausea, vomiting, or sweating.  You have back pain that is a rhythmic, cramping pain similar to labor pains. Labor pain is usually 1-2 minutes apart, lasts for about 1 minute, and involves a bearing down feeling or pressure in your pelvis.  You have back pain and your water breaks or you have vaginal bleeding.  You have back pain or numbness  that travels down your leg.  Your back pain developed after you fell.  You develop pain on one side of your back.  You see blood in your urine.  You develop skin blisters in the area of your back pain. Summary  Back pain may be caused by several factors that are related to changes during your pregnancy.  Follow instructions as told by your health care provider for managing pain, stiffness, and swelling.  Exercise as told by your health care provider. Gentle exercise is the best way to prevent or manage back pain.  Take over-the-counter and  prescription medicines only as told by your health care provider.  Keep all follow-up visits as told by your health care provider. This is important. This information is not intended to replace advice given to you by your health care provider. Make sure you discuss any questions you have with your health care provider. Document Revised: 07/17/2018 Document Reviewed: 09/13/2017 Elsevier Patient Education  [place].   Preventing Injuries During Pregnancy  Injuries can happen during pregnancy. Minor falls and accidents usually do not harm you or your baby. But some injuries can harm you and your baby. Tell your doctor about any injury you suffer. What can I do to avoid injuries? Safety  Remove rugs and loose objects on the floor.  Wear comfortable shoes that have a good grip. Do not wear shoes that have high heels.  Always wear your seat belt in the car. The lap belt should be below your belly. Always drive safely.  Do not ride on a motorcycle. Activity  Do not take part in rough and violent activities or sports.  Avoid: ? Walking on wet or slippery floors. ? Lifting heavy pots of boiling or hot liquids. ? Fixing electrical problems. ? Being near fires. General instructions  Take over-the-counter and prescription medicines only as told by your doctor.  Know your blood type and the blood type of the baby's father.  If you are a victim of domestic violence: ? Call your local emergency services (911 in the [place] ? Contact the [employer] Violence Hotline for help and support. Get help right away if:  You fall on your belly or receive any serious blow to your belly.  You have a stiff neck or neck pain after a fall or an injury.  You get a headache or have problems with vision after an injury.  You do not feel the baby move or the baby is not moving as much as normal.  You have been a victim of domestic violence or any other kind of attack.  You have been  in a car accident.  You have bleeding from your vagina.  Fluid is leaking from your vagina.  You start to have cramping or pain in your belly (contractions).  You have very bad pain in your lower back.  You feel weak or pass out (faint).  You start to throw up (vomit) after an injury.  You have been burned. Summary  Some injuries that happen during pregnancy can do harm to the baby.  Tell your doctor about any injury.  Take steps to avoid injury. This includes removing rugs and loose objects on the floor. Always wear your seat belt in the car.  Do not take part in rough and violent activities or sports.  Get help right away if you have any serious accident or injury. This information is not intended to replace advice given to you by your health care  provider. Make sure you discuss any questions you have with your health care provider. Document Revised: 12/21/2018 Document Reviewed: 04/06/2016 Elsevier Patient Education  2020 [employer].
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
## 2019-07-31 ENCOUNTER — Encounter (HOSPITAL_COMMUNITY): Payer: Self-pay

## 2019-07-31 ENCOUNTER — Other Ambulatory Visit: Payer: Self-pay | Admitting: Obstetrics

## 2019-07-31 NOTE — Patient Instructions (Signed)
Diana Mccormick  07/31/2019   Your procedure is scheduled on:  08/10/2019  Arrive at 9 at Entrance C on [address] at [facility]  and [address]. You are invited to use the FREE valet parking or use the Visitor's parking deck.  Pick up the phone at the desk and dial [phone].  Call this number if you have problems the morning of surgery: [phone]  Remember:   Do not eat food:(After Midnight) Desps de medianoche.  Do not drink clear liquids: (After Midnight) Desps de medianoche.  Take these medicines the morning of surgery with A SIP OF WATER:  none   Do not wear jewelry, make-up or nail polish.  Do not wear lotions, powders, or perfumes. Do not wear deodorant.  Do not shave 48 hours prior to surgery.  Do not bring valuables to the hospital.  [facility] is not   responsible for any belongings or valuables brought to the hospital.  Contacts, dentures or bridgework may not be worn into surgery.  Leave suitcase in the car. After surgery it may be brought to your room.  For patients admitted to the hospital, checkout time is 11:00 AM the day of              discharge.      Please read over the following fact sheets that you were given:     Preparing for Surgery

## 2019-08-01 ENCOUNTER — Encounter (HOSPITAL_COMMUNITY): Payer: Self-pay

## 2019-08-06 ENCOUNTER — Encounter (HOSPITAL_COMMUNITY): Payer: Self-pay | Admitting: Obstetrics and Gynecology

## 2019-08-06 ENCOUNTER — Inpatient Hospital Stay (HOSPITAL_COMMUNITY)
Admission: AD | Admit: 2019-08-06 | Discharge: 2019-08-11 | DRG: 787 | Disposition: A | Discharge status: Home / Self Care | Payer: 59 | Attending: Obstetrics and Gynecology | Admitting: Obstetrics and Gynecology

## 2019-08-06 DIAGNOSIS — O3413 Maternal care for benign tumor of corpus uteri, third trimester: Secondary | ICD-10-CM | POA: Diagnosis present

## 2019-08-06 DIAGNOSIS — R030 Elevated blood-pressure reading, without diagnosis of hypertension: Secondary | ICD-10-CM | POA: Diagnosis not present

## 2019-08-06 DIAGNOSIS — O321XX0 Maternal care for breech presentation, not applicable or unspecified: Secondary | ICD-10-CM | POA: Diagnosis present

## 2019-08-06 DIAGNOSIS — D62 Acute posthemorrhagic anemia: Secondary | ICD-10-CM | POA: Diagnosis not present

## 2019-08-06 DIAGNOSIS — N856 Intrauterine synechiae: Secondary | ICD-10-CM

## 2019-08-06 DIAGNOSIS — Z87891 Personal history of nicotine dependence: Secondary | ICD-10-CM

## 2019-08-06 DIAGNOSIS — O1404 Mild to moderate pre-eclampsia, complicating childbirth: Principal | ICD-10-CM | POA: Diagnosis present

## 2019-08-06 DIAGNOSIS — O9081 Anemia of the puerperium: Secondary | ICD-10-CM | POA: Diagnosis not present

## 2019-08-06 DIAGNOSIS — Z3A38 38 weeks gestation of pregnancy: Secondary | ICD-10-CM

## 2019-08-06 DIAGNOSIS — O1493 Unspecified pre-eclampsia, third trimester: Secondary | ICD-10-CM | POA: Diagnosis present

## 2019-08-06 DIAGNOSIS — D252 Subserosal leiomyoma of uterus: Secondary | ICD-10-CM | POA: Diagnosis present

## 2019-08-06 DIAGNOSIS — Z20822 Contact with and (suspected) exposure to covid-19: Secondary | ICD-10-CM | POA: Diagnosis present

## 2019-08-06 NOTE — MAU Provider Note (Signed)
Chief Complaint:  Headache   First Provider Initiated Contact with Patient 08/06/19 2310     HPI: Diana Mccormick is a 34 y.o. G4P0030 at 1w3dwho presents to maternity admissions reporting elevated BP and headache.  Was seen for this in office today.  Labs reported as normal by [employer] CNM.  States office BPs were normal .  She reports good fetal movement, denies LOF, vaginal bleeding, vaginal itching/burning, urinary symptoms, dizziness, n/v, diarrhea, constipation or fever/chills.  She denies visual changes or RUQ abdominal pain.  Headache  This is a new problem. The current episode started today. The quality of the pain is described as aching. The pain is mild. Pertinent negatives include no abdominal pain, back pain, fever, nausea, photophobia or visual change. Nothing aggravates the symptoms. She has tried acetaminophen for the symptoms. The treatment provided no relief.    RN Note: PT SAYS BP 150/100 AT HOME - WAS 160/106. WENT TO DR TODAY - IS [facility] FOR C/S ON SAT - BREECH.  HAD TRACE OF PROTEIN IN URINE . Marland KitchenHAS SLIGHT H/A - - TOOK XS 1.5 TABS AT 8PM- NO RELIEF . [name] FEELS MILD - OCC UC'S.   Past Medical History: Past Medical History:  Diagnosis Date  . Anxiety    has prescription for buspar but has not taken it.  . Asthma    daily inhaler use  . Fibroid   . Migraine     Past obstetric history: OB History  Gravida Para Term Preterm AB Living  4       3 0  SAB TAB Ectopic Multiple Live Births  1 2          # Outcome Date GA Lbr Len/2nd Weight Sex Delivery Anes PTL Lv  4 Current           3 SAB           2 TAB           1 TAB             Past Surgical History: Past Surgical History:  Procedure Laterality Date  . NO PAST SURGERIES      Family History: Family History  Problem Relation Age of Onset  . Hypertension Other   . Hypertension Maternal Aunt   . Cancer Maternal Grandmother        cervical  . Diabetes Maternal Grandmother   . Cirrhosis Paternal Grandmother    . Cirrhosis Paternal Grandfather     Social History: Social History   Tobacco Use  . Smoking status: Former Smoker    Quit date: 10/03/2018    Years since quitting: 0.8  . Smokeless tobacco: Never Used  Substance Use Topics  . Alcohol use: Not Currently    Alcohol/week: 0.0 standard drinks  . Drug use: No    Allergies:  Allergies  Allergen Reactions  . Bactrim [Sulfamethoxazole-Trimethoprim] Anaphylaxis    Hypotension and Fever-like symptoms  . Sulfur Anaphylaxis    Low heart rate    Meds:  Medications Prior to Admission  Medication Sig Dispense Refill Last Dose  . albuterol (PROVENTIL) (2.5 MG/3ML) 0.083% nebulizer solution Take 3 mLs (2.5 mg total) by nebulization every 4 (four) hours as needed for wheezing or shortness of breath. 75 mL 1   . albuterol (VENTOLIN HFA) 108 (90 Base) MCG/ACT inhaler Inhale into the lungs as directed.     . budesonide-formoterol (SYMBICORT) 160-4.5 MCG/ACT inhaler Inhale 2 puffs into the lungs 2 (two) times daily. 1 Inhaler  6   . cyclobenzaprine (FLEXERIL) 10 MG tablet Take 1 tablet (10 mg total) by mouth every 8 (eight) hours as needed for muscle spasms. 30 tablet 0   . montelukast (SINGULAIR) 10 MG tablet Take 1 tablet (10 mg total) by mouth at bedtime. 30 tablet 6     I have reviewed patient's Past Medical Hx, Surgical Hx, Family Hx, Social Hx, medications and allergies.   ROS:  Review of Systems  Constitutional: Negative for fever.  Eyes: Negative for photophobia.  Gastrointestinal: Negative for abdominal pain and nausea.  Musculoskeletal: Negative for back pain.  Neurological: Positive for headaches.   Other systems negative  Physical Exam   Vitals:   08/06/19 2255 08/06/19 2311 08/06/19 2330  BP: (!) 156/92 (!) 144/95 (!) 142/92  Pulse: 89 91 96  Resp: 20    Temp: 98.4 F (36.9 C)    TempSrc: Oral    Weight: 107.5 kg    Height: 5\' 6"  (1.676 m)     Vitals:   08/06/19 2311 08/06/19 2330 08/07/19 0000 08/07/19 0015   BP: (!) 144/95 (!) 142/92 (!) 138/92 (!) 138/93  Pulse: 91 96 92 89  Resp:      Temp:      TempSrc:      Weight:      Height:       Vitals:   08/07/19 0100 08/07/19 0115 08/07/19 0130 08/07/19 0145  BP: (!) 148/91 (!) 141/87 (!) 148/90 (!) 161/109  Pulse: 80 79 81 90  Resp:      Temp:      TempSrc:      Weight:      Height:        Constitutional: Well-developed, well-nourished female in no acute distress.  Cardiovascular: normal rate and rhythm Respiratory: normal effort, clear to auscultation bilaterally GI: Abd soft, non-tender, gravid appropriate for gestational age.   No rebound or guarding. MS: Extremities nontender, 1+ pedal and pretibial edema, normal ROM Neurologic: Alert and oriented x 4. DTRs 3+ with no clonus GU: Neg CVAT.  PELVIC EXAM: deferred  FHT:  Baseline 130 , moderate variability, accelerations present, no decelerations Contractions:  Irregular     Labs: Results for orders placed or performed during the hospital encounter of 08/06/19 (from the past 24 hour(s))  Protein / creatinine ratio, urine     Status: Abnormal   Collection Time: 08/06/19 11:24 PM  Result Value Ref Range   Creatinine, Urine 176.96 mg/dL   Total Protein, Urine 54 mg/dL   Protein Creatinine Ratio 0.31 (H) 0.00 - 0.15 mg/mg[Cre]  CBC     Status: Abnormal   Collection Time: 08/06/19 11:57 PM  Result Value Ref Range   WBC 7.0 4.0 - 10.5 K/uL   RBC 4.04 3.87 - 5.11 MIL/uL   Hemoglobin 11.7 (L) 12.0 - 15.0 g/dL   HCT 35.3 (L) 36.0 - 46.0 %   MCV 87.4 80.0 - 100.0 fL   MCH 29.0 26.0 - 34.0 pg   MCHC 33.1 30.0 - 36.0 g/dL   RDW 13.9 11.5 - 15.5 %   Platelets 184 150 - 400 K/uL   nRBC 0.0 0.0 - 0.2 %  Comprehensive metabolic panel     Status: Abnormal   Collection Time: 08/06/19 11:57 PM  Result Value Ref Range   Sodium 137 135 - 145 mmol/L   Potassium 3.5 3.5 - 5.1 mmol/L   Chloride 103 98 - 111 mmol/L   CO2 21 (L) 22 - 32 mmol/L  Glucose, Bld 87 70 - 99 mg/dL   BUN 12 6 -  20 mg/dL   Creatinine, Ser 0.66 0.44 - 1.00 mg/dL   Calcium 9.0 8.9 - 10.3 mg/dL   Total Protein 6.4 (L) 6.5 - 8.1 g/dL   Albumin 2.7 (L) 3.5 - 5.0 g/dL   AST 27 15 - 41 U/L   ALT 20 0 - 44 U/L   Alkaline Phosphatase 89 38 - 126 U/L   Total Bilirubin 0.4 0.3 - 1.2 mg/dL   GFR calc non Af Amer >60 >60 mL/min   GFR calc Af Amer >60 >60 mL/min   Anion gap 13 5 - 15  Uric acid     Status: None   Collection Time: 08/06/19 11:57 PM  Result Value Ref Range   Uric Acid, Serum 2.9 2.5 - 7.1 mg/dL    A/Positive/-- (10/12 0000)  Imaging:  No results found.  MAU Course/MDM: I have ordered labs and reviewed results.  NST reviewed, reactive RN notified CNM from [facility] since pt had labs in office and we cannot see results.  CNM has spoken with Dr Ronita Hipps who recommends discharge home if labs are normal.    Reviewed lab results with CNM from [facility]. She spoke with Dr Ronita Hipps who plans C/S later today.  CNM will come see patient and make decision about whether to admit or discharge home until C/S.  Assessment: Single IUP at [redacted]w[redacted]d Gestational Hypertension  Plan: [place] CNM to assume care  Hansel Feinstein CNM, MSN Certified Nurse-Midwife 08/06/2019 11:10 PM

## 2019-08-06 NOTE — MAU Note (Signed)
PT SAYS BP 150/100 AT HOME - WAS 160/106. WENT TO DR TODAY - IS [facility] FOR C/S ON SAT - BREECH.  HAD TRACE OF PROTEIN IN URINE . Marland KitchenHAS SLIGHT H/A - - TOOK XS 1.5 TABS AT 8PM- NO RELIEF . [name] FEELS MILD - OCC UC'S.

## 2019-08-07 ENCOUNTER — Other Ambulatory Visit: Payer: Self-pay

## 2019-08-07 ENCOUNTER — Encounter (HOSPITAL_COMMUNITY)
Admission: AD | Disposition: A | Discharge status: Home / Self Care | Payer: Self-pay | Source: Home / Self Care | Attending: Obstetrics and Gynecology

## 2019-08-07 ENCOUNTER — Encounter (HOSPITAL_COMMUNITY): Payer: Self-pay | Admitting: Obstetrics and Gynecology

## 2019-08-07 ENCOUNTER — Inpatient Hospital Stay (HOSPITAL_COMMUNITY): Payer: 59 | Admitting: Anesthesiology

## 2019-08-07 DIAGNOSIS — O3413 Maternal care for benign tumor of corpus uteri, third trimester: Secondary | ICD-10-CM | POA: Diagnosis present

## 2019-08-07 DIAGNOSIS — Z3A38 38 weeks gestation of pregnancy: Secondary | ICD-10-CM | POA: Diagnosis not present

## 2019-08-07 DIAGNOSIS — O1404 Mild to moderate pre-eclampsia, complicating childbirth: Secondary | ICD-10-CM | POA: Diagnosis present

## 2019-08-07 DIAGNOSIS — D62 Acute posthemorrhagic anemia: Secondary | ICD-10-CM | POA: Diagnosis not present

## 2019-08-07 DIAGNOSIS — O1493 Unspecified pre-eclampsia, third trimester: Secondary | ICD-10-CM | POA: Diagnosis present

## 2019-08-07 DIAGNOSIS — Z87891 Personal history of nicotine dependence: Secondary | ICD-10-CM | POA: Diagnosis not present

## 2019-08-07 DIAGNOSIS — Z20822 Contact with and (suspected) exposure to covid-19: Secondary | ICD-10-CM | POA: Diagnosis present

## 2019-08-07 DIAGNOSIS — R030 Elevated blood-pressure reading, without diagnosis of hypertension: Secondary | ICD-10-CM | POA: Diagnosis present

## 2019-08-07 DIAGNOSIS — O9081 Anemia of the puerperium: Secondary | ICD-10-CM | POA: Diagnosis not present

## 2019-08-07 DIAGNOSIS — O321XX0 Maternal care for breech presentation, not applicable or unspecified: Secondary | ICD-10-CM | POA: Diagnosis present

## 2019-08-07 DIAGNOSIS — D252 Subserosal leiomyoma of uterus: Secondary | ICD-10-CM | POA: Diagnosis present

## 2019-08-07 LAB — TYPE AND SCREEN
ABO/RH(D): A POS
Antibody Screen: NEGATIVE

## 2019-08-07 LAB — CBC
HCT: 35.3 % — ABNORMAL LOW (ref 36.0–46.0)
HCT: 36.6 % (ref 36.0–46.0)
Hemoglobin: 11.7 g/dL — ABNORMAL LOW (ref 12.0–15.0)
Hemoglobin: 12.1 g/dL (ref 12.0–15.0)
MCH: 29 pg (ref 26.0–34.0)
MCH: 29 pg (ref 26.0–34.0)
MCHC: 33.1 g/dL (ref 30.0–36.0)
MCHC: 33.1 g/dL (ref 30.0–36.0)
MCV: 87.4 fL (ref 80.0–100.0)
MCV: 87.8 fL (ref 80.0–100.0)
Platelets: 184 10*3/uL (ref 150–400)
Platelets: 198 10*3/uL (ref 150–400)
RBC: 4.04 MIL/uL (ref 3.87–5.11)
RBC: 4.17 MIL/uL (ref 3.87–5.11)
RDW: 13.9 % (ref 11.5–15.5)
RDW: 14 % (ref 11.5–15.5)
WBC: 5.8 10*3/uL (ref 4.0–10.5)
WBC: 7 10*3/uL (ref 4.0–10.5)
nRBC: 0 % (ref 0.0–0.2)
nRBC: 0 % (ref 0.0–0.2)

## 2019-08-07 LAB — COMPREHENSIVE METABOLIC PANEL
ALT: 20 U/L (ref 0–44)
AST: 27 U/L (ref 15–41)
Albumin: 2.7 g/dL — ABNORMAL LOW (ref 3.5–5.0)
Alkaline Phosphatase: 89 U/L (ref 38–126)
Anion gap: 13 (ref 5–15)
BUN: 12 mg/dL (ref 6–20)
CO2: 21 mmol/L — ABNORMAL LOW (ref 22–32)
Calcium: 9 mg/dL (ref 8.9–10.3)
Chloride: 103 mmol/L (ref 98–111)
Creatinine, Ser: 0.66 mg/dL (ref 0.44–1.00)
GFR calc Af Amer: >60 mL/min (ref >60)
GFR calc non Af Amer: >60 mL/min (ref >60)
Glucose, Bld: 87 mg/dL (ref 70–99)
Potassium: 3.5 mmol/L (ref 3.5–5.1)
Sodium: 137 mmol/L (ref 135–145)
Total Bilirubin: 0.4 mg/dL (ref 0.3–1.2)
Total Protein: 6.4 g/dL — ABNORMAL LOW (ref 6.5–8.1)

## 2019-08-07 LAB — PROTEIN / CREATININE RATIO, URINE
Creatinine, Urine: 176.96 mg/dL
Protein Creatinine Ratio: 0.31 mg/mg{Cre} — ABNORMAL HIGH (ref 0.00–0.15)
Total Protein, Urine: 54 mg/dL

## 2019-08-07 LAB — RESPIRATORY PANEL BY RT PCR (FLU A&B, COVID)
Influenza A by PCR: NEGATIVE
Influenza B by PCR: NEGATIVE
SARS Coronavirus 2 by RT PCR: NEGATIVE

## 2019-08-07 LAB — RPR: RPR Ser Ql: NONREACTIVE

## 2019-08-07 LAB — URIC ACID: Uric Acid, Serum: 2.9 mg/dL (ref 2.5–7.1)

## 2019-08-07 SURGERY — Surgical Case
Anesthesia: Spinal

## 2019-08-07 MED ORDER — PHENYLEPHRINE 40 MCG/ML (10ML) SYRINGE FOR IV PUSH (FOR BLOOD PRESSURE SUPPORT)
PREFILLED_SYRINGE | INTRAVENOUS | Status: AC
  Filled 2019-08-07: qty 10

## 2019-08-07 MED ORDER — NALOXONE HCL 4 MG/10ML IJ SOLN
1.0000 ug/kg/h | INTRAVENOUS | Status: DC | PRN
  Filled 2019-08-07: qty 5

## 2019-08-07 MED ORDER — TRANEXAMIC ACID-NACL 1000-0.7 MG/100ML-% IV SOLN
INTRAVENOUS | Status: DC | PRN
  Administered 2019-08-07: 1000 mg via INTRAVENOUS

## 2019-08-07 MED ORDER — SODIUM CHLORIDE 0.9 % IV SOLN: INTRAVENOUS | Status: DC | PRN

## 2019-08-07 MED ORDER — ONDANSETRON HCL 4 MG/2ML IJ SOLN: 4.0000 mg | Freq: Three times a day (TID) | INTRAMUSCULAR | Status: DC | PRN

## 2019-08-07 MED ORDER — SOD CITRATE-CITRIC ACID 500-334 MG/5ML PO SOLN
ORAL | Status: AC
  Filled 2019-08-07: qty 30

## 2019-08-07 MED ORDER — SODIUM CHLORIDE 0.9 % IV SOLN: 250.0000 mL | INTRAVENOUS | Status: DC | PRN

## 2019-08-07 MED ORDER — SODIUM CHLORIDE 0.9% FLUSH: 3.0000 mL | INTRAVENOUS | Status: DC | PRN

## 2019-08-07 MED ORDER — PHENYLEPHRINE HCL-NACL 10-0.9 MG/250ML-% IV SOLN
INTRAVENOUS | Status: DC | PRN
  Administered 2019-08-07: 60 ug/min via INTRAVENOUS

## 2019-08-07 MED ORDER — OXYTOCIN 40 UNITS IN NORMAL SALINE INFUSION - SIMPLE MED: 2.5000 [IU]/h | INTRAVENOUS | Status: AC

## 2019-08-07 MED ORDER — NALOXONE HCL 0.4 MG/ML IJ SOLN: 0.4000 mg | INTRAMUSCULAR | Status: DC | PRN

## 2019-08-07 MED ORDER — PHENYLEPHRINE HCL (PRESSORS) 10 MG/ML IV SOLN
INTRAVENOUS | Status: DC | PRN
Start: 2019-08-07 — End: 2019-08-07
  Administered 2019-08-07 (×4): 100 ug via INTRAVENOUS

## 2019-08-07 MED ORDER — METHYLERGONOVINE MALEATE 0.2 MG/ML IJ SOLN: 0.2000 mg | INTRAMUSCULAR | Status: DC | PRN

## 2019-08-07 MED ORDER — PHENYLEPHRINE HCL-NACL 20-0.9 MG/250ML-% IV SOLN
INTRAVENOUS | Status: AC
  Filled 2019-08-07: qty 250

## 2019-08-07 MED ORDER — OXYTOCIN 40 UNITS IN NORMAL SALINE INFUSION - SIMPLE MED
INTRAVENOUS | Status: AC
  Filled 2019-08-07: qty 1000

## 2019-08-07 MED ORDER — HYDRALAZINE HCL 20 MG/ML IJ SOLN: 5.0000 mg | INTRAMUSCULAR | Status: DC | PRN

## 2019-08-07 MED ORDER — DEXAMETHASONE SODIUM PHOSPHATE 10 MG/ML IJ SOLN
INTRAMUSCULAR | Status: DC | PRN
Start: 2019-08-07 — End: 2019-08-07
  Administered 2019-08-07: 10 mg via INTRAVENOUS

## 2019-08-07 MED ORDER — DIBUCAINE (PERIANAL) 1 % EX OINT: 1.0000 "application " | TOPICAL_OINTMENT | CUTANEOUS | Status: DC | PRN

## 2019-08-07 MED ORDER — ZOLPIDEM TARTRATE 5 MG PO TABS: 5.0000 mg | ORAL_TABLET | Freq: Every evening | ORAL | Status: DC | PRN

## 2019-08-07 MED ORDER — SODIUM CHLORIDE 0.9% FLUSH
3.0000 mL | Freq: Two times a day (BID) | INTRAVENOUS | Status: DC
  Administered 2019-08-08 – 2019-08-09 (×3): 3 mL via INTRAVENOUS

## 2019-08-07 MED ORDER — NALBUPHINE HCL 10 MG/ML IJ SOLN: 5.0000 mg | INTRAMUSCULAR | Status: DC | PRN

## 2019-08-07 MED ORDER — LABETALOL HCL 5 MG/ML IV SOLN: 40.0000 mg | INTRAVENOUS | Status: DC | PRN

## 2019-08-07 MED ORDER — KETOROLAC TROMETHAMINE 30 MG/ML IJ SOLN
30.0000 mg | Freq: Once | INTRAMUSCULAR | Status: AC | PRN
  Administered 2019-08-07: 30 mg via INTRAVENOUS

## 2019-08-07 MED ORDER — MAGNESIUM SULFATE 40 GM/1000ML IV SOLN
INTRAVENOUS | Status: AC
  Filled 2019-08-07: qty 1000

## 2019-08-07 MED ORDER — SCOPOLAMINE 1 MG/3DAYS TD PT72
MEDICATED_PATCH | TRANSDERMAL | Status: AC
  Filled 2019-08-07: qty 1

## 2019-08-07 MED ORDER — CALCIUM CARBONATE ANTACID 500 MG PO CHEW: 2.0000 | CHEWABLE_TABLET | ORAL | Status: DC | PRN

## 2019-08-07 MED ORDER — TETANUS-DIPHTH-ACELL PERTUSSIS 5-2.5-18.5 LF-MCG/0.5 IM SUSP: 0.5000 mL | Freq: Once | INTRAMUSCULAR | Status: DC

## 2019-08-07 MED ORDER — METHYLERGONOVINE MALEATE 0.2 MG PO TABS: 0.2000 mg | ORAL_TABLET | ORAL | Status: DC | PRN

## 2019-08-07 MED ORDER — PRENATAL MULTIVITAMIN CH
1.0000 | ORAL_TABLET | Freq: Every day | ORAL | Status: DC
  Administered 2019-08-07 – 2019-08-11 (×5): 1 via ORAL
  Filled 2019-08-07 (×3): qty 1

## 2019-08-07 MED ORDER — MAGNESIUM SULFATE 40 GM/1000ML IV SOLN
2.0000 g/h | INTRAVENOUS | Status: AC
  Administered 2019-08-08: 04:00:00 2 g/h via INTRAVENOUS
  Filled 2019-08-07: qty 1000

## 2019-08-07 MED ORDER — SIMETHICONE 80 MG PO CHEW
80.0000 mg | CHEWABLE_TABLET | Freq: Three times a day (TID) | ORAL | Status: DC
  Administered 2019-08-07 – 2019-08-11 (×10): 80 mg via ORAL
  Filled 2019-08-07 (×10): qty 1

## 2019-08-07 MED ORDER — MAGNESIUM SULFATE BOLUS VIA INFUSION
4.0000 g | Freq: Once | INTRAVENOUS | Status: AC
  Administered 2019-08-07: 12:00:00 4 g via INTRAVENOUS
  Filled 2019-08-07: qty 1000

## 2019-08-07 MED ORDER — OXYCODONE HCL 5 MG PO TABS
5.0000 mg | ORAL_TABLET | ORAL | Status: DC | PRN
  Administered 2019-08-08 – 2019-08-10 (×10): 5 mg via ORAL
  Filled 2019-08-07 (×11): qty 1

## 2019-08-07 MED ORDER — DIPHENHYDRAMINE HCL 25 MG PO CAPS: 25.0000 mg | ORAL_CAPSULE | ORAL | Status: DC | PRN

## 2019-08-07 MED ORDER — SIMETHICONE 80 MG PO CHEW: 80.0000 mg | CHEWABLE_TABLET | ORAL | Status: DC | PRN

## 2019-08-07 MED ORDER — FENTANYL CITRATE (PF) 100 MCG/2ML IJ SOLN
INTRAMUSCULAR | Status: AC
  Filled 2019-08-07: qty 2

## 2019-08-07 MED ORDER — NALBUPHINE HCL 10 MG/ML IJ SOLN: 5.0000 mg | Freq: Once | INTRAMUSCULAR | Status: DC | PRN

## 2019-08-07 MED ORDER — ONDANSETRON HCL 4 MG/2ML IJ SOLN
INTRAMUSCULAR | Status: AC
  Filled 2019-08-07: qty 2

## 2019-08-07 MED ORDER — DIPHENHYDRAMINE HCL 50 MG/ML IJ SOLN: 12.5000 mg | INTRAMUSCULAR | Status: DC | PRN

## 2019-08-07 MED ORDER — LABETALOL HCL 5 MG/ML IV SOLN: 20.0000 mg | INTRAVENOUS | Status: DC | PRN

## 2019-08-07 MED ORDER — PROMETHAZINE HCL 25 MG/ML IJ SOLN: 6.2500 mg | INTRAMUSCULAR | Status: DC | PRN

## 2019-08-07 MED ORDER — BUPIVACAINE HCL (PF) 0.25 % IJ SOLN
INTRAMUSCULAR | Status: DC | PRN
  Administered 2019-08-07: 30 mL

## 2019-08-07 MED ORDER — COCONUT OIL OIL: 1.0000 "application " | TOPICAL_OIL | Status: DC | PRN

## 2019-08-07 MED ORDER — LACTATED RINGERS IV SOLN: INTRAVENOUS | Status: DC

## 2019-08-07 MED ORDER — HYDROMORPHONE HCL 1 MG/ML IJ SOLN: 0.2500 mg | INTRAMUSCULAR | Status: DC | PRN

## 2019-08-07 MED ORDER — MENTHOL 3 MG MT LOZG: 1.0000 | LOZENGE | OROMUCOSAL | Status: DC | PRN

## 2019-08-07 MED ORDER — IBUPROFEN 800 MG PO TABS
800.0000 mg | ORAL_TABLET | Freq: Four times a day (QID) | ORAL | Status: DC
  Administered 2019-08-08 – 2019-08-11 (×13): 800 mg via ORAL
  Filled 2019-08-07 (×13): qty 1

## 2019-08-07 MED ORDER — OXYTOCIN 40 UNITS IN NORMAL SALINE INFUSION - SIMPLE MED
INTRAVENOUS | Status: DC | PRN
Start: 2019-08-07 — End: 2019-08-07
  Administered 2019-08-07: 40 [IU] via INTRAVENOUS

## 2019-08-07 MED ORDER — SIMETHICONE 80 MG PO CHEW
80.0000 mg | CHEWABLE_TABLET | ORAL | Status: DC
  Administered 2019-08-08 – 2019-08-10 (×4): 80 mg via ORAL
  Filled 2019-08-07 (×4): qty 1

## 2019-08-07 MED ORDER — WITCH HAZEL-GLYCERIN EX PADS: 1.0000 "application " | MEDICATED_PAD | CUTANEOUS | Status: DC | PRN

## 2019-08-07 MED ORDER — LACTATED RINGERS IV SOLN: INTRAVENOUS | Status: DC | PRN

## 2019-08-07 MED ORDER — SCOPOLAMINE 1 MG/3DAYS TD PT72
1.0000 | MEDICATED_PATCH | Freq: Once | TRANSDERMAL | Status: AC
  Administered 2019-08-07: 1.5 mg via TRANSDERMAL

## 2019-08-07 MED ORDER — DIPHENHYDRAMINE HCL 25 MG PO CAPS: 25.0000 mg | ORAL_CAPSULE | Freq: Four times a day (QID) | ORAL | Status: DC | PRN

## 2019-08-07 MED ORDER — ONDANSETRON HCL 4 MG/2ML IJ SOLN
INTRAMUSCULAR | Status: DC | PRN
  Administered 2019-08-07: 4 mg via INTRAVENOUS

## 2019-08-07 MED ORDER — MEPERIDINE HCL 25 MG/ML IJ SOLN: 6.2500 mg | INTRAMUSCULAR | Status: DC | PRN

## 2019-08-07 MED ORDER — HYDRALAZINE HCL 20 MG/ML IJ SOLN: 10.0000 mg | INTRAMUSCULAR | Status: DC | PRN

## 2019-08-07 MED ORDER — ACETAMINOPHEN 325 MG PO TABS
650.0000 mg | ORAL_TABLET | ORAL | Status: DC | PRN
  Administered 2019-08-07 – 2019-08-09 (×6): 650 mg via ORAL
  Filled 2019-08-07 (×6): qty 2

## 2019-08-07 MED ORDER — KETOROLAC TROMETHAMINE 30 MG/ML IJ SOLN
INTRAMUSCULAR | Status: AC
  Filled 2019-08-07: qty 1

## 2019-08-07 MED ORDER — CEFAZOLIN SODIUM-DEXTROSE 2-4 GM/100ML-% IV SOLN
2.0000 g | INTRAVENOUS | Status: AC
  Administered 2019-08-07: 10:00:00 2 g via INTRAVENOUS
  Filled 2019-08-07: qty 100

## 2019-08-07 MED ORDER — DOCUSATE SODIUM 100 MG PO CAPS
100.0000 mg | ORAL_CAPSULE | Freq: Every day | ORAL | Status: DC
  Administered 2019-08-08 – 2019-08-10 (×3): 100 mg via ORAL
  Filled 2019-08-07 (×4): qty 1

## 2019-08-07 MED ORDER — BUPIVACAINE HCL (PF) 0.25 % IJ SOLN
INTRAMUSCULAR | Status: AC
  Filled 2019-08-07: qty 30

## 2019-08-07 MED ORDER — KETOROLAC TROMETHAMINE 30 MG/ML IJ SOLN
30.0000 mg | Freq: Four times a day (QID) | INTRAMUSCULAR | Status: AC
  Administered 2019-08-07 – 2019-08-08 (×3): 30 mg via INTRAVENOUS
  Filled 2019-08-07 (×3): qty 1

## 2019-08-07 MED ORDER — PRENATAL MULTIVITAMIN CH
1.0000 | ORAL_TABLET | Freq: Every day | ORAL | Status: DC
  Filled 2019-08-07 (×2): qty 1

## 2019-08-07 MED ORDER — OXYCODONE-ACETAMINOPHEN 5-325 MG PO TABS: 1.0000 | ORAL_TABLET | ORAL | Status: DC | PRN

## 2019-08-07 MED ORDER — MORPHINE SULFATE (PF) 0.5 MG/ML IJ SOLN
INTRAMUSCULAR | Status: DC | PRN
  Administered 2019-08-07: .15 mg via INTRATHECAL

## 2019-08-07 MED ORDER — NALBUPHINE HCL 10 MG/ML IJ SOLN
5.0000 mg | INTRAMUSCULAR | Status: DC | PRN
  Administered 2019-08-07: 5 mg via INTRAVENOUS
  Filled 2019-08-07: qty 1

## 2019-08-07 MED ORDER — SOD CITRATE-CITRIC ACID 500-334 MG/5ML PO SOLN
30.0000 mL | Freq: Once | ORAL | Status: AC
  Administered 2019-08-07: 10:00:00 30 mL via ORAL

## 2019-08-07 MED ORDER — FENTANYL CITRATE (PF) 100 MCG/2ML IJ SOLN
INTRAMUSCULAR | Status: DC | PRN
  Administered 2019-08-07: 15 ug via INTRATHECAL

## 2019-08-07 MED ORDER — BUPIVACAINE IN DEXTROSE 0.75-8.25 % IT SOLN
INTRATHECAL | Status: DC | PRN
Start: 2019-08-07 — End: 2019-08-07
  Administered 2019-08-07: 1.5 mL via INTRATHECAL

## 2019-08-07 MED ORDER — SENNOSIDES-DOCUSATE SODIUM 8.6-50 MG PO TABS
2.0000 | ORAL_TABLET | ORAL | Status: DC
  Administered 2019-08-08 – 2019-08-10 (×4): 2 via ORAL
  Filled 2019-08-07 (×4): qty 2

## 2019-08-07 MED ORDER — MORPHINE SULFATE (PF) 0.5 MG/ML IJ SOLN
INTRAMUSCULAR | Status: AC
  Filled 2019-08-07: qty 10

## 2019-08-07 SURGICAL SUPPLY — 37 items
APL SKNCLS STERI-STRIP NONHPOA (GAUZE/BANDAGES/DRESSINGS) ×1
BENZOIN TINCTURE PRP APPL 2/3 (GAUZE/BANDAGES/DRESSINGS) ×1 IMPLANT
CHLORAPREP W/TINT 26ML (MISCELLANEOUS) ×2 IMPLANT
CLAMP CORD UMBIL (MISCELLANEOUS) IMPLANT
CLOTH BEACON ORANGE TIMEOUT ST (SAFETY) ×2 IMPLANT
DRSG OPSITE POSTOP 4X10 (GAUZE/BANDAGES/DRESSINGS) ×2 IMPLANT
ELECT REM PT RETURN 9FT ADLT (ELECTROSURGICAL) ×2
ELECTRODE REM PT RTRN 9FT ADLT (ELECTROSURGICAL) ×1 IMPLANT
EXTRACTOR VACUUM M CUP 4 TUBE (SUCTIONS) IMPLANT
GLOVE BIO SURGEON STRL SZ7.5 (GLOVE) ×2 IMPLANT
GLOVE BIOGEL PI IND STRL 7.0 (GLOVE) ×1 IMPLANT
GLOVE BIOGEL PI INDICATOR 7.0 (GLOVE) ×1
GOWN STRL REUS W/TWL LRG LVL3 (GOWN DISPOSABLE) ×4 IMPLANT
KIT ABG SYR 3ML LUER SLIP (SYRINGE) IMPLANT
NDL HYPO 25X5/8 SAFETYGLIDE (NEEDLE) IMPLANT
NDL SPNL 20GX3.5 QUINCKE YW (NEEDLE) IMPLANT
NEEDLE HYPO 22GX1.5 SAFETY (NEEDLE) ×2 IMPLANT
NEEDLE HYPO 25X5/8 SAFETYGLIDE (NEEDLE) IMPLANT
NEEDLE SPNL 20GX3.5 QUINCKE YW (NEEDLE) IMPLANT
NS IRRIG 1000ML POUR BTL (IV SOLUTION) ×2 IMPLANT
PACK C SECTION WH (CUSTOM PROCEDURE TRAY) ×2 IMPLANT
PENCIL SMOKE EVAC W/HOLSTER (ELECTROSURGICAL) ×2 IMPLANT
STRIP CLOSURE SKIN 1/2X4 (GAUZE/BANDAGES/DRESSINGS) ×1 IMPLANT
SUT MNCRL 0 VIOLET CTX 36 (SUTURE) ×2 IMPLANT
SUT MNCRL AB 3-0 PS2 27 (SUTURE) IMPLANT
SUT MON AB 2-0 CT1 27 (SUTURE) ×2 IMPLANT
SUT MON AB-0 CT1 36 (SUTURE) ×4 IMPLANT
SUT MONOCRYL 0 CTX 36 (SUTURE) ×4
SUT PLAIN 0 NONE (SUTURE) IMPLANT
SUT PLAIN 2 0 (SUTURE)
SUT PLAIN 2 0 XLH (SUTURE) IMPLANT
SUT PLAIN ABS 2-0 CT1 27XMFL (SUTURE) IMPLANT
SYR 20CC LL (SYRINGE) IMPLANT
SYR CONTROL 10ML LL (SYRINGE) ×2 IMPLANT
TOWEL OR 17X24 6PK STRL BLUE (TOWEL DISPOSABLE) ×2 IMPLANT
TRAY FOLEY W/BAG SLVR 14FR LF (SET/KITS/TRAYS/PACK) ×2 IMPLANT
WATER STERILE IRR 1000ML POUR (IV SOLUTION) ×2 IMPLANT

## 2019-08-07 NOTE — Progress Notes (Signed)
OB OR notified of severe range BP at 0958. Holding pt transport until BP recheck per protocol.    Derrell Lolling, CNM at bedside at 1005 requesting immediate transport to OR. Derrell Lolling, CNM present for transport.

## 2019-08-07 NOTE — Lactation Note (Signed)
This note was copied from a baby's chart. Lactation Consultation Note  Patient Name: [name] Today's Date: 08/07/2019 Reason for consult: Initial assessment;1st time breastfeeding;Early term 37-38.6wks  [id number] - [id number] - I conducted an initial visit with Diana Mccormick. She is 4 hours postpartum, and she stated that she appreciated the lactation help, although she is exhausted. 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  I assisted with skin to skin, and we attempted to latch in the football hold on the left breast. Baby opened mouth for breast and held nipple in her mouth. She gave a few practice sucks but appeared disorganized. I verified this by allowing baby to suckle on a gloved finger and noted disorganization.  Lab came in during visit and took blood sample due to baby's jitteriness. I placed baby STS and covered her back and placed hat on head.   Dad and I hand expressed - I showed him how to do it; we finger fed colostrum to baby and rubbed into her gums. I noted a bit more organization with drops of colostrum.  I reviewed differences between colostrum and breast milk and day one expectations and feeding frequency. I suggested that lactation follow up later today. Diana Mccormick indicated that she was getting sleepy; encouraged dad to do some STS as well.  Recommended feeding baby 8-12 times on demand. Feeding cues reviewed. Diana Mccormick's mother is also in the room for support. Very supportive of breast feeding.  Maternal Data Formula Feeding for Exclusion: No Has patient been taught Hand Expression?: Yes Does the patient have breastfeeding experience prior to this delivery?: No  Feeding Feeding Type: Breast Fed  LATCH Score Latch: Repeated attempts needed to sustain latch, nipple held in mouth throughout feeding, stimulation needed to elicit sucking reflex.  Audible Swallowing:  None  Type of Nipple: Flat  Comfort (Breast/Nipple): Soft / non-tender  Hold (Positioning): Assistance needed to correctly position infant at breast and maintain latch.  LATCH Score: 5  Interventions Interventions: Breast feeding basics reviewed;Hand express;Assisted with latch;Skin to skin;Breast massage;Breast compression;Expressed milk  Lactation Tools Discussed/Used     Consult Status Consult Status: Follow-up Date: 08/07/19 Follow-up type: In-patient    Lenore Manner 08/07/2019, 3:30 PM

## 2019-08-07 NOTE — Transfer of Care (Signed)
Immediate Anesthesia Transfer of Care Note  Patient: Diana Mccormick  Procedure(s) Performed: CESAREAN SECTION (N/A )  Patient Location: PACU  Anesthesia Type:Spinal  Level of Consciousness: awake and alert   Airway & Oxygen Therapy: Patient Spontanous Breathing  Post-op Assessment: Report given to RN and Post -op Vital signs reviewed and stable  Post vital signs: Reviewed and stable  Last Vitals:  Vitals Value Taken Time  BP 109/82 08/07/19 1118  Temp    Pulse 72 08/07/19 1120  Resp 16 08/07/19 1120  SpO2 98 % 08/07/19 1120  Vitals shown include unvalidated device data.  Last Pain:  Vitals:   08/07/19 1003  TempSrc:   PainSc: 1       Patients Stated Pain Goal: 2 ([id number])  Complications: No apparent anesthesia complications

## 2019-08-07 NOTE — H&P (Signed)
Chief Complaint: Headache   First Provider Initiated Contact with Patient 08/06/19 2310  HPI: Diana Mccormick is a 34 y.o. G4P0030 at 34w3dwho presents to maternity admissions reporting elevated BP and headache. Was seen for this in office today. Labs reported as normal by [employer] CNM. States office BPs were normal . She reports good fetal movement, denies LOF, vaginal bleeding, vaginal itching/burning, urinary symptoms, dizziness, n/v, diarrhea, constipation or fever/chills. She denies visual changes or RUQ abdominal pain.  Headache  This is a new problem. The current episode started today. The quality of the pain is described as aching. The pain is mild. Pertinent negatives include no abdominal pain, back pain, fever, nausea, photophobia or visual change. Nothing aggravates the symptoms. She has tried acetaminophen for the symptoms. The treatment provided no relief.   RN Note:  PT SAYS BP 150/100 AT HOME - WAS 160/106. WENT TO DR TODAY - IS [facility] FOR C/S ON SAT - BREECH. HAD TRACE OF PROTEIN IN URINE . Marland KitchenHAS SLIGHT H/A - - TOOK XS 1.5 TABS AT 8PM- NO RELIEF . [name] FEELS MILD - OCC UC'S.  Past Medical History:      Past Medical History:  Diagnosis Date  . Anxiety    has prescription for buspar but has not taken it.  . Asthma    daily inhaler use  . Fibroid   . Migraine    Past obstetric history:                  OB History  Gravida Para Term Preterm AB Living  4    3 0  SAB TAB Ectopic Multiple Live Births     1 2          # Outcome Date GA Lbr Len/2nd Weight Sex Delivery Anes PTL Lv  4 Current           3 SAB           2 TAB           1 TAB            Past Surgical History:       Past Surgical History:  Procedure Laterality Date  . NO PAST SURGERIES     Family History:       Family History  Problem Relation Age of Onset  . Hypertension Other   . Hypertension Maternal Aunt   . Cancer Maternal Grandmother    cervical  . Diabetes Maternal Grandmother   . Cirrhosis  Paternal Grandmother   . Cirrhosis Paternal Grandfather    Social History:  Social History        Tobacco Use  . Smoking status: Former Smoker    Quit date: 10/03/2018    Years since quitting: 0.8  . Smokeless tobacco: Never Used  Substance Use Topics  . Alcohol use: Not Currently    Alcohol/week: 0.0 standard drinks  . Drug use: No   Allergies:       Allergies  Allergen Reactions  . Bactrim [Sulfamethoxazole-Trimethoprim] Anaphylaxis    Hypotension and Fever-like symptoms  . Sulfur Anaphylaxis    Low heart rate   Meds:         Medications Prior to Admission  Medication Sig Dispense Refill Last Dose  . albuterol (PROVENTIL) (2.5 MG/3ML) 0.083% nebulizer solution Take 3 mLs (2.5 mg total) by nebulization every 4 (four) hours as needed for wheezing or shortness of breath. 75 mL 1   . albuterol (VENTOLIN HFA) 108 (90  Base) MCG/ACT inhaler Inhale into the lungs as directed.     . budesonide-formoterol (SYMBICORT) 160-4.5 MCG/ACT inhaler Inhale 2 puffs into the lungs 2 (two) times daily. 1 Inhaler 6   . cyclobenzaprine (FLEXERIL) 10 MG tablet Take 1 tablet (10 mg total) by mouth every 8 (eight) hours as needed for muscle spasms. 30 tablet 0   . montelukast (SINGULAIR) 10 MG tablet Take 1 tablet (10 mg total) by mouth at bedtime. 30 tablet 6    I have reviewed patient's Past Medical Hx, Surgical Hx, Family Hx, Social Hx, medications and allergies.  ROS:  Review of Systems  Constitutional: Negative for fever.  Eyes: Negative for photophobia.  Gastrointestinal: Negative for abdominal pain and nausea.  Musculoskeletal: Negative for back pain.  Neurological: Positive for headaches.   Other systems negative  Physical Exam    Patient Vitals for the past 24 hrs:  BP Temp Temp src Pulse Resp Height Weight  08/07/19 0200 (!) 152/100 -- -- 72 -- -- --  08/07/19 0145 (!) 161/109 -- -- 90 -- -- --  08/07/19 0130 (!) 148/90 -- -- 81 -- -- --  08/07/19 0115 (!) 141/87 -- -- 79 -- --  --  08/07/19 0100 (!) 148/91 -- -- 80 -- -- --  08/07/19 0045 (!) 147/91 -- -- 79 -- -- --  08/07/19 0030 (!) 141/93 -- -- 86 -- -- --  08/07/19 0015 (!) 138/93 -- -- 89 -- -- --  08/07/19 0000 (!) 138/92 -- -- 92 -- -- --  08/06/19 2345 137/90 -- -- 91 -- -- --  08/06/19 2330 (!) 142/92 -- -- 96 -- -- --  08/06/19 2311 (!) 144/95 -- -- 91 -- -- --  08/06/19 2255 (!) 156/92 98.4 F (36.9 C) Oral 89 20 5\' 6"  (1.676 m) 107.5 kg   Constitutional: Well-developed, well-nourished female in no acute distress.  Cardiovascular: normal rate and rhythm  Respiratory: normal effort, clear to auscultation bilaterally  GI: Abd soft, non-tender, gravid appropriate for gestational age. No rebound or guarding.  MS: Extremities nontender, 1+ pedal and pretibial edema, normal ROM  Neurologic: Alert and oriented x 4. DTRs 3+ with no clonus  GU: Neg CVAT.  PELVIC EXAM: deferred  FHT: Baseline 130 , moderate variability, accelerations present, no decelerations  Contractions: Irregular  Results for orders placed or performed during the hospital encounter of 08/06/19 (from the past 24 hour(s))  Protein / creatinine ratio, urine     Status: Abnormal   Collection Time: 08/06/19 11:24 PM  Result Value Ref Range   Creatinine, Urine 176.96 mg/dL   Total Protein, Urine 54 mg/dL   Protein Creatinine Ratio 0.31 (H) 0.00 - 0.15 mg/mg[Cre]  CBC     Status: Abnormal   Collection Time: 08/06/19 11:57 PM  Result Value Ref Range   WBC 7.0 4.0 - 10.5 K/uL   RBC 4.04 3.87 - 5.11 MIL/uL   Hemoglobin 11.7 (L) 12.0 - 15.0 g/dL   HCT 35.3 (L) 36.0 - 46.0 %   MCV 87.4 80.0 - 100.0 fL   MCH 29.0 26.0 - 34.0 pg   MCHC 33.1 30.0 - 36.0 g/dL   RDW 13.9 11.5 - 15.5 %   Platelets 184 150 - 400 K/uL   nRBC 0.0 0.0 - 0.2 %  Comprehensive metabolic panel     Status: Abnormal   Collection Time: 08/06/19 11:57 PM  Result Value Ref Range   Sodium 137 135 - 145 mmol/L   Potassium 3.5 3.5 - 5.1  mmol/L   Chloride 103 98 - 111  mmol/L   CO2 21 (L) 22 - 32 mmol/L   Glucose, Bld 87 70 - 99 mg/dL   BUN 12 6 - 20 mg/dL   Creatinine, Ser 0.66 0.44 - 1.00 mg/dL   Calcium 9.0 8.9 - 10.3 mg/dL   Total Protein 6.4 (L) 6.5 - 8.1 g/dL   Albumin 2.7 (L) 3.5 - 5.0 g/dL   AST 27 15 - 41 U/L   ALT 20 0 - 44 U/L   Alkaline Phosphatase 89 38 - 126 U/L   Total Bilirubin 0.4 0.3 - 1.2 mg/dL   GFR calc non Af Amer >60 >60 mL/min   GFR calc Af Amer >60 >60 mL/min   Anion gap 13 5 - 15  Uric acid     Status: None   Collection Time: 08/06/19 11:57 PM  Result Value Ref Range   Uric Acid, Serum 2.9 2.5 - 7.1 mg/dL   MAU Course/MDM:  I have ordered labs and reviewed results.  NST reviewed, reactive  RN notified CNM from [facility] since pt had labs in office and we cannot see results. CNM has spoken with Dr Ronita Hipps who recommends discharge home if labs are normal.  Reviewed lab results with CNM from [facility]. She spoke with Dr Ronita Hipps who plans C/S later today. CNM will come see patient and make decision about whether to admit or discharge home until C/S.  [facility] CNM to assume care. I came to assess patient at 0145am after labs resulted to discuss plan. Upon arrival, pt had severe range blood pressure. She still has a mild HA, but no visual changes, RUQ/epigastric pain. Pt. States she does not want to go home before c/s, and I agree.  Bedside sono confirmed breech.   Assessment:  Single IUP at [redacted]w[redacted]d  Pre-eclampsia   Plan: Admit to [facility] for primary LTCS for breech presentation, pre-eclampsia later today. NPO. Admission labs and COVID test. Continuous fetal monitoring. Pre-eclampsia protocol PRN. Will determine scheduling with MD and OR.   POC in consult with Dr. Stevan Born, CNM

## 2019-08-07 NOTE — Anesthesia Procedure Notes (Signed)
Spinal  Patient location during procedure: OR Staffing Anesthesiologist: Nolon Nations, MD Preanesthetic Checklist Completed: patient identified, IV checked, site marked, risks and benefits discussed, surgical consent, monitors and equipment checked, pre-op evaluation and timeout performed Spinal Block Patient position: sitting Prep: DuraPrep Patient monitoring: heart rate, cardiac monitor, continuous pulse ox and blood pressure Approach: midline Location: L3-4 Injection technique: single-shot Needle Needle type: Sprotte  Needle gauge: 24 G Needle length: 9 cm Assessment Sensory level: T4

## 2019-08-07 NOTE — Progress Notes (Addendum)
No report of headache per RN BP (!) 150/96 (BP Location: Right Arm)   Pulse 78   Temp 98.4 F (36.9 C) (Oral)   Resp 17   Ht 5\' 6"  (1.676 m)   Wt 107.5 kg   LMP 10/07/2018   SpO2 99%   BMI 38.25 kg/m   Category 1 tracing CBC    Component Value Date/Time   WBC 7.0 08/06/2019 2357   RBC 4.04 08/06/2019 2357   HGB 11.7 (L) 08/06/2019 2357   HCT 35.3 (L) 08/06/2019 2357   PLT 184 08/06/2019 2357   MCV 87.4 08/06/2019 2357   MCH 29.0 08/06/2019 2357   MCHC 33.1 08/06/2019 2357   RDW 13.9 08/06/2019 2357   LYMPHSABS 1.6 03/30/2019 1527   MONOABS 0.5 03/30/2019 1527   EOSABS 0.1 03/30/2019 1527   BASOSABS 0.0 03/30/2019 1527   CMP     Component Value Date/Time   NA 137 08/06/2019 2357   K 3.5 08/06/2019 2357   CL 103 08/06/2019 2357   CO2 21 (L) 08/06/2019 2357   GLUCOSE 87 08/06/2019 2357   BUN 12 08/06/2019 2357   CREATININE 0.66 08/06/2019 2357   CALCIUM 9.0 08/06/2019 2357   PROT 6.4 (L) 08/06/2019 2357   ALBUMIN 2.7 (L) 08/06/2019 2357   AST 27 08/06/2019 2357   ALT 20 08/06/2019 2357   ALKPHOS 89 08/06/2019 2357   BILITOT 0.4 08/06/2019 2357   GFRNONAA >60 08/06/2019 2357   GFRAA >60 08/06/2019 2357   PEC w/o severe features Breech  Keep NPO this am and add to OR schedule for primary csection and delivery.  Patient seen and examined. Consent witnessed and signed. No changes noted. Update completed.

## 2019-08-07 NOTE — Op Note (Signed)
Cesarean Section Procedure Note  Indications: malpresentation: frank breech and PEC  Pre-operative Diagnosis: 38 week 4 day pregnancy.  Post-operative Diagnosis: same  Surgeon: Lovenia Kim   Assistants: Eddie Dibbles, cnm  Anesthesia: Local anesthesia 0.25.% bupivacaine and Spinal anesthesia  ASA Class: 2  Procedure Details  The patient was seen in the Holding Room. The risks, benefits, complications, treatment options, and expected outcomes were discussed with the patient.  The patient concurred with the proposed plan, giving informed consent. The risks of anesthesia, infection, bleeding and possible injury to other organs discussed. Injury to bowel, bladder, or ureter with possible need for repair discussed. Possible need for transfusion with secondary risks of hepatitis or HIV acquisition discussed. Post operative complications to include but not limited to DVT, PE and Pneumonia noted. The site of surgery properly noted/marked. The patient was taken to Operating Room # C, identified as Georgiann Mccoy and the procedure verified as C-Section Delivery. A Time Out was held and the above information confirmed.  After induction of anesthesia, the patient was draped and prepped in the usual sterile manner. A Pfannenstiel incision was made and carried down through the subcutaneous tissue to the fascia. Fascial incision was made and extended transversely using Mayo scissors. The fascia was separated from the underlying rectus tissue superiorly and inferiorly. The peritoneum was identified and entered. Peritoneal incision was extended longitudinally. The utero-vesical peritoneal reflection was incised transversely and the bladder flap was bluntly freed from the lower uterine segment. A low transverse uterine incision(Kerr hysterotomy) was made. Delivered from frank breech presentation was a  female with Apgar scores of 8 at one minute and 9 at five minutes. Bulb suctioning gently performed. Neonatal team  in attendance.After the umbilical cord was clamped and cut cord blood was obtained for evaluation. The placenta was removed intact and appeared normal. The uterus was curetted with a dry lap pack. Good hemostasis was noted.The uterine outline, tubes and ovaries appeared normal. The uterine incision was closed with running locked sutures of 0 Monocryl x 2 layers. Interrupted sutures at left angle.[name] Hemostasis was observed. Lavage was carried out until clear.The parietal peritoneum was closed with a running 2-0 Monocryl suture. The fascia was then reapproximated with running sutures of 0 Monocryl. The skin was reapproximated with 4-0 vicryl after [place] closure with 2-0 plain.  Instrument, sponge, and needle counts were correct prior the abdominal closure and at the conclusion of the case.   Findings: FTLF, multiple subserosal fibroids, largest post, none in LUS  Estimated Blood Loss:  600         Drains: foley                 Specimens: placenta                 Complications:  None; patient tolerated the procedure well.         Disposition: PACU - hemodynamically stable.         Condition: stable  Attending Attestation: I performed the procedure.

## 2019-08-07 NOTE — Addendum Note (Signed)
Addendum  created 08/07/19 1610 by Raylan Hanton, Tollie Eth, CRNA   Flowsheet accepted, Intraprocedure Flowsheets edited

## 2019-08-07 NOTE — Anesthesia Preprocedure Evaluation (Signed)
Anesthesia Evaluation  Patient identified by MRN, date of birth, ID band Patient awake    Reviewed: Allergy & Precautions, NPO status , Patient's Chart, lab work & pertinent test results  Airway Mallampati: II  TM Distance: >3 FB Neck ROM: Full    Dental no notable dental hx. (+) Dental Advisory Given   Pulmonary asthma , former smoker,    Pulmonary exam normal breath sounds clear to auscultation       Cardiovascular hypertension, Normal cardiovascular exam Rhythm:Regular Rate:Normal     Neuro/Psych  Headaches, negative psych ROS   GI/Hepatic negative GI ROS, Neg liver ROS,   Endo/Other  negative endocrine ROS  Renal/GU negative Renal ROS     Musculoskeletal negative musculoskeletal ROS (+)   Abdominal (+) + obese,   Peds  Hematology negative hematology ROS (+)   Anesthesia Other Findings   Reproductive/Obstetrics (+) Pregnancy                             Anesthesia Physical Anesthesia Plan  ASA: III  Anesthesia Plan: Spinal   Post-op Pain Management:    Induction: Intravenous  PONV Risk Score and Plan: 3 and Ondansetron, Dexamethasone, Scopolamine patch - Pre-op and Treatment may vary due to age or medical condition  Airway Management Planned:   Additional Equipment: None  Intra-op Plan:   Post-operative Plan:   Informed Consent: I have reviewed the patients History and Physical, chart, labs and discussed the procedure including the risks, benefits and alternatives for the proposed anesthesia with the patient or authorized representative who has indicated his/her understanding and acceptance.     Dental advisory given  Plan Discussed with: CRNA  Anesthesia Plan Comments:        Anesthesia Quick Evaluation

## 2019-08-07 NOTE — Anesthesia Postprocedure Evaluation (Signed)
Anesthesia Post Note  Patient: Diana Mccormick  Procedure(s) Performed: CESAREAN SECTION (N/A )     Patient location during evaluation: PACU Anesthesia Type: Spinal Level of consciousness: awake and alert Pain management: pain level controlled Vital Signs Assessment: post-procedure vital signs reviewed and stable Respiratory status: spontaneous breathing Cardiovascular status: stable Anesthetic complications: no    Last Vitals:  Vitals:   08/07/19 1300 08/07/19 1330  BP:  (!) 143/83  Pulse:  85  Resp:  16  Temp:  36.9 C  SpO2: 96% 97%    Last Pain:  Vitals:   08/07/19 1330  TempSrc: Oral  PainSc: 0-No pain                 Nolon Nations

## 2019-08-07 NOTE — H&P (Addendum)
Chief Complaint: Headache   First Provider Initiated Contact with Patient 08/06/19 2310  HPI: Diana Mccormick is a 34 y.o. G4P0030 at 88w3dwho presents to maternity admissions reporting elevated BP and headache. Was seen for this in office today. Labs reported as normal by [employer] CNM. States office BPs were normal . She reports good fetal movement, denies LOF, vaginal bleeding, vaginal itching/burning, urinary symptoms, dizziness, n/v, diarrhea, constipation or fever/chills. She denies visual changes or RUQ abdominal pain.  Headache  This is a new problem. The current episode started today. The quality of the pain is described as aching. The pain is mild. Pertinent negatives include no abdominal pain, back pain, fever, nausea, photophobia or visual change. Nothing aggravates the symptoms. She has tried acetaminophen for the symptoms. The treatment provided no relief.   RN Note:  PT SAYS BP 150/100 AT HOME - WAS 160/106. WENT TO DR TODAY - IS [facility] FOR C/S ON SAT - BREECH. HAD TRACE OF PROTEIN IN URINE . Marland KitchenHAS SLIGHT H/A - - TOOK XS 1.5 TABS AT 8PM- NO RELIEF . [name] FEELS MILD - OCC UC'S.  Past Medical History:      Past Medical History:  Diagnosis Date  . Anxiety    has prescription for buspar but has not taken it.  . Asthma    daily inhaler use  . Fibroid   . Migraine    Past obstetric history:                  OB History  Gravida Para Term Preterm AB Living  4    3 0  SAB TAB Ectopic Multiple Live Births     1 2          # Outcome Date GA Lbr Len/2nd Weight Sex Delivery Anes PTL Lv  4 Current           3 SAB           2 TAB           1 TAB            Past Surgical History:       Past Surgical History:  Procedure Laterality Date  . NO PAST SURGERIES     Family History:       Family History  Problem Relation Age of Onset  . Hypertension Other   . Hypertension Maternal Aunt   . Cancer Maternal Grandmother    cervical  . Diabetes Maternal Grandmother   . Cirrhosis  Paternal Grandmother   . Cirrhosis Paternal Grandfather    Social History:  Social History        Tobacco Use  . Smoking status: Former Smoker    Quit date: 10/03/2018    Years since quitting: 0.8  . Smokeless tobacco: Never Used  Substance Use Topics  . Alcohol use: Not Currently    Alcohol/week: 0.0 standard drinks  . Drug use: No   Allergies:       Allergies  Allergen Reactions  . Bactrim [Sulfamethoxazole-Trimethoprim] Anaphylaxis    Hypotension and Fever-like symptoms  . Sulfur Anaphylaxis    Low heart rate   Meds:         Medications Prior to Admission  Medication Sig Dispense Refill Last Dose  . albuterol (PROVENTIL) (2.5 MG/3ML) 0.083% nebulizer solution Take 3 mLs (2.5 mg total) by nebulization every 4 (four) hours as needed for wheezing or shortness of breath. 75 mL 1   . albuterol (VENTOLIN HFA) 108 (90  Base) MCG/ACT inhaler Inhale into the lungs as directed.     . budesonide-formoterol (SYMBICORT) 160-4.5 MCG/ACT inhaler Inhale 2 puffs into the lungs 2 (two) times daily. 1 Inhaler 6   . cyclobenzaprine (FLEXERIL) 10 MG tablet Take 1 tablet (10 mg total) by mouth every 8 (eight) hours as needed for muscle spasms. 30 tablet 0   . montelukast (SINGULAIR) 10 MG tablet Take 1 tablet (10 mg total) by mouth at bedtime. 30 tablet 6    I have reviewed patient's Past Medical Hx, Surgical Hx, Family Hx, Social Hx, medications and allergies.  ROS:  Review of Systems  Constitutional: Negative for fever.  Eyes: Negative for photophobia.  Gastrointestinal: Negative for abdominal pain and nausea.  Musculoskeletal: Negative for back pain.  Neurological: Positive for headaches.   Other systems negative  Physical Exam    Patient Vitals for the past 24 hrs:  BP Temp Temp src Pulse Resp SpO2 Height Weight  08/07/19 1130 (!) 119/92 -- -- 78 10 98 % -- --  08/07/19 1118 109/82 97.9 F (36.6 C) Oral 80 19 95 % -- --  08/07/19 0954 (!) 162/100 -- -- 86 18 99 % -- --  08/07/19  0722 (!) 142/78 -- -- 74 18 98 % -- --  08/07/19 0345 (!) 150/96 -- -- -- 17 -- -- --  08/07/19 0344 -- -- -- -- -- 99 % -- --  08/07/19 0339 -- -- -- -- -- 99 % -- --  08/07/19 0334 -- -- -- -- -- 99 % -- --  08/07/19 0329 -- -- -- -- -- 99 % -- --  08/07/19 0324 -- -- -- -- -- 99 % -- --  08/07/19 0323 (!) 157/85 -- -- 71 18 -- -- --  08/07/19 0300 (!) 144/92 -- -- 79 -- -- -- --  08/07/19 0254 (!) 157/93 -- -- 74 -- -- -- --  08/07/19 0226 -- -- -- -- -- 98 % -- --  08/07/19 0221 -- -- -- -- -- 98 % -- --  08/07/19 0216 -- -- -- -- -- 100 % -- --  08/07/19 0215 (!) 156/97 -- -- 69 -- -- -- --  08/07/19 0211 -- -- -- -- -- 100 % -- --  08/07/19 0206 -- -- -- -- -- 99 % -- --  08/07/19 0201 -- -- -- -- -- 98 % -- --  08/07/19 0200 (!) 152/100 -- -- 72 -- -- -- --  08/07/19 0145 (!) 161/109 -- -- 90 -- -- -- --  08/07/19 0130 (!) 148/90 -- -- 81 -- -- -- --  08/07/19 0115 (!) 141/87 -- -- 79 -- -- -- --  08/07/19 0100 (!) 148/91 -- -- 80 -- -- -- --  08/07/19 0045 (!) 147/91 -- -- 79 -- -- -- --  08/07/19 0030 (!) 141/93 -- -- 86 -- -- -- --  08/07/19 0015 (!) 138/93 -- -- 89 -- -- -- --  08/07/19 0000 (!) 138/92 -- -- 92 -- -- -- --  08/06/19 2345 137/90 -- -- 91 -- -- -- --  08/06/19 2330 (!) 142/92 -- -- 96 -- -- -- --  08/06/19 2311 (!) 144/95 -- -- 91 -- -- -- --  08/06/19 2255 (!) 156/92 98.4 F (36.9 C) Oral 89 20 -- 5\' 6"  (1.676 m) 107.5 kg   Constitutional: Well-developed, well-nourished female in no acute distress.  Cardiovascular: normal rate and rhythm  Respiratory: normal effort, clear to auscultation bilaterally  GI: Abd soft,  non-tender, gravid appropriate for gestational age. No rebound or guarding.  MS: Extremities nontender, 1+ pedal and pretibial edema, normal ROM  Neurologic: Alert and oriented x 4. DTRs 3+ with no clonus  GU: Neg CVAT.  PELVIC EXAM: deferred  FHT: Baseline 130 , moderate variability, accelerations present, no decelerations  Contractions:  Irregular  Results for orders placed or performed during the hospital encounter of 08/06/19 (from the past 24 hour(s))  Protein / creatinine ratio, urine     Status: Abnormal   Collection Time: 08/06/19 11:24 PM  Result Value Ref Range   Creatinine, Urine 176.96 mg/dL   Total Protein, Urine 54 mg/dL   Protein Creatinine Ratio 0.31 (H) 0.00 - 0.15 mg/mg[Cre]  CBC     Status: Abnormal   Collection Time: 08/06/19 11:57 PM  Result Value Ref Range   WBC 7.0 4.0 - 10.5 K/uL   RBC 4.04 3.87 - 5.11 MIL/uL   Hemoglobin 11.7 (L) 12.0 - 15.0 g/dL   HCT 35.3 (L) 36.0 - 46.0 %   MCV 87.4 80.0 - 100.0 fL   MCH 29.0 26.0 - 34.0 pg   MCHC 33.1 30.0 - 36.0 g/dL   RDW 13.9 11.5 - 15.5 %   Platelets 184 150 - 400 K/uL   nRBC 0.0 0.0 - 0.2 %  Comprehensive metabolic panel     Status: Abnormal   Collection Time: 08/06/19 11:57 PM  Result Value Ref Range   Sodium 137 135 - 145 mmol/L   Potassium 3.5 3.5 - 5.1 mmol/L   Chloride 103 98 - 111 mmol/L   CO2 21 (L) 22 - 32 mmol/L   Glucose, Bld 87 70 - 99 mg/dL   BUN 12 6 - 20 mg/dL   Creatinine, Ser 0.66 0.44 - 1.00 mg/dL   Calcium 9.0 8.9 - 10.3 mg/dL   Total Protein 6.4 (L) 6.5 - 8.1 g/dL   Albumin 2.7 (L) 3.5 - 5.0 g/dL   AST 27 15 - 41 U/L   ALT 20 0 - 44 U/L   Alkaline Phosphatase 89 38 - 126 U/L   Total Bilirubin 0.4 0.3 - 1.2 mg/dL   GFR calc non Af Amer >60 >60 mL/min   GFR calc Af Amer >60 >60 mL/min   Anion gap 13 5 - 15  Uric acid     Status: None   Collection Time: 08/06/19 11:57 PM  Result Value Ref Range   Uric Acid, Serum 2.9 2.5 - 7.1 mg/dL  Respiratory Panel by RT PCR (Flu A&B, Covid) - Nasopharyngeal Swab     Status: None   Collection Time: 08/07/19  2:25 AM   Specimen: Nasopharyngeal Swab  Result Value Ref Range   SARS Coronavirus 2 by RT PCR NEGATIVE NEGATIVE   Influenza A by PCR NEGATIVE NEGATIVE   Influenza B by PCR NEGATIVE NEGATIVE  Type and screen [place]     Status: None   Collection Time:  08/07/19  2:42 AM  Result Value Ref Range   ABO/RH(D) A POS    Antibody Screen NEG    Sample Expiration      08/10/2019,2359 Performed at [facility], [address] [address]   CBC     Status: None   Collection Time: 08/07/19  9:07 AM  Result Value Ref Range   WBC 5.8 4.0 - 10.5 K/uL   RBC 4.17 3.87 - 5.11 MIL/uL   Hemoglobin 12.1 12.0 - 15.0 g/dL   HCT 36.6 36.0 -  46.0 %   MCV 87.8 80.0 - 100.0 fL   MCH 29.0 26.0 - 34.0 pg   MCHC 33.1 30.0 - 36.0 g/dL   RDW 14.0 11.5 - 15.5 %   Platelets 198 150 - 400 K/uL   nRBC 0.0 0.0 - 0.2 %  RPR     Status: None   Collection Time: 08/07/19  9:07 AM  Result Value Ref Range   RPR Ser Ql NON REACTIVE NON REACTIVE      Assessment:  Single IUP at [redacted]w[redacted]d  Pre-eclampsia   Plan: Admit to [facility] primary [facility] for breech presentation, pre-eclampsia later today. NPO. Admission labs and COVID test. Continuous fetal monitoring. Pre-eclampsia protocol PRN. Will determine scheduling with OR.     Lars Pinks, CNM

## 2019-08-08 ENCOUNTER — Other Ambulatory Visit (HOSPITAL_COMMUNITY)
Admission: RE | Admit: 2019-08-08 | Discharge: 2019-08-08 | Disposition: A | Discharge status: Home / Self Care | Payer: 59 | Source: Ambulatory Visit | Attending: Obstetrics | Admitting: Obstetrics

## 2019-08-08 LAB — CBC
HCT: 30.5 % — ABNORMAL LOW (ref 36.0–46.0)
Hemoglobin: 9.9 g/dL — ABNORMAL LOW (ref 12.0–15.0)
MCH: 28.8 pg (ref 26.0–34.0)
MCHC: 32.5 g/dL (ref 30.0–36.0)
MCV: 88.7 fL (ref 80.0–100.0)
Platelets: 165 10*3/uL (ref 150–400)
RBC: 3.44 MIL/uL — ABNORMAL LOW (ref 3.87–5.11)
RDW: 14.2 % (ref 11.5–15.5)
WBC: 11.8 10*3/uL — ABNORMAL HIGH (ref 4.0–10.5)
nRBC: 0 % (ref 0.0–0.2)

## 2019-08-08 LAB — COMPREHENSIVE METABOLIC PANEL
ALT: 17 U/L (ref 0–44)
AST: 25 U/L (ref 15–41)
Albumin: 2.5 g/dL — ABNORMAL LOW (ref 3.5–5.0)
Alkaline Phosphatase: 73 U/L (ref 38–126)
Anion gap: 10 (ref 5–15)
BUN: 7 mg/dL (ref 6–20)
CO2: 21 mmol/L — ABNORMAL LOW (ref 22–32)
Calcium: 7.3 mg/dL — ABNORMAL LOW (ref 8.9–10.3)
Chloride: 103 mmol/L (ref 98–111)
Creatinine, Ser: 0.84 mg/dL (ref 0.44–1.00)
GFR calc Af Amer: >60 mL/min (ref >60)
GFR calc non Af Amer: >60 mL/min (ref >60)
Glucose, Bld: 105 mg/dL — ABNORMAL HIGH (ref 70–99)
Potassium: 3.8 mmol/L (ref 3.5–5.1)
Sodium: 134 mmol/L — ABNORMAL LOW (ref 135–145)
Total Bilirubin: 0.2 mg/dL — ABNORMAL LOW (ref 0.3–1.2)
Total Protein: 5.6 g/dL — ABNORMAL LOW (ref 6.5–8.1)

## 2019-08-08 MED ORDER — MAGNESIUM OXIDE 400 (241.3 MG) MG PO TABS
400.0000 mg | ORAL_TABLET | Freq: Every day | ORAL | Status: DC
  Administered 2019-08-08 – 2019-08-11 (×4): 400 mg via ORAL
  Filled 2019-08-08 (×4): qty 1

## 2019-08-08 MED ORDER — POLYSACCHARIDE IRON COMPLEX 150 MG PO CAPS
150.0000 mg | ORAL_CAPSULE | Freq: Every day | ORAL | Status: DC
  Administered 2019-08-08 – 2019-08-11 (×4): 150 mg via ORAL
  Filled 2019-08-08 (×4): qty 1

## 2019-08-08 NOTE — Progress Notes (Signed)
Patient ID: Diana Mccormick, female   DOB: 1985-10-13, 34 y.o.   MRN: [id number] Subjective: POD# 1 Live born female  Birth Weight: 6 lb 8.9 oz (2975 g) APGAR: 8, 9  Newborn Delivery   Birth date/time: 08/07/2019 10:36:00 Delivery type: C-Section, Low Transverse Trial of labor: No C-section categorization: Primary     Baby name: Legrand Rams Delivering provider: Brien Few   Feeding: breast  Pain control at delivery: Spinal   Reports feeling well, has been up and moving well today, denies PEC sx.   Patient reports tolerating PO.   Breast symptoms: poor latch, working w/ LC Pain controlled with PO meds Denies HA/SOB/C/P/N/V/dizziness. Flatus absent. She reports vaginal bleeding as normal, without clots.  She is ambulating, urinating without difficulty.     Objective:    Patient Vitals for the past 24 hrs:  BP Temp Temp src Pulse Resp SpO2  08/08/19 0858 (!) 145/78 97.8 F (36.6 C) Oral 94 18 98 %  08/08/19 0700 -- -- -- -- 17 --  08/08/19 0600 -- -- -- -- 18 --  08/08/19 0500 -- -- -- -- 16 --  08/08/19 0400 -- -- -- -- 16 --  08/08/19 0345 117/65 98.2 F (36.8 C) Oral 85 18 97 %  08/08/19 0300 -- -- -- -- 16 --  08/08/19 0200 -- -- -- -- 17 --  08/08/19 0100 -- -- -- -- 16 --  08/08/19 0001 116/72 97.9 F (36.6 C) Oral 88 17 --  08/07/19 2300 -- -- -- -- 16 --  08/07/19 2200 -- -- -- -- 16 --  08/07/19 2101 136/83 -- -- 97 17 95 %  08/07/19 1955 135/79 -- -- 90 17 --  08/07/19 1924 138/77 97.7 F (36.5 C) Oral 92 18 96 %  08/07/19 1838 (!) 144/83 -- -- 98 16 95 %  08/07/19 1742 130/77 -- -- 93 16 95 %  08/07/19 1633 130/75 97.9 F (36.6 C) Oral 83 16 97 %  08/07/19 1535 (!) 151/81 98 F (36.7 C) Oral 77 15 96 %  08/07/19 1435 (!) 143/85 -- -- 80 16 --  08/07/19 1330 (!) 143/83 98.4 F (36.9 C) Oral 85 16 97 %  08/07/19 1300 -- -- -- -- -- 96 %  08/07/19 1240 140/81 98 F (36.7 C) Oral 75 15 97 %  08/07/19 1221 130/85 -- -- 74 14 96 %  08/07/19 1215 128/80  -- -- 71 12 99 %  08/07/19 1206 -- -- -- 65 10 98 %  08/07/19 1200 (!) 142/86 98.1 F (36.7 C) Oral 71 18 99 %  08/07/19 1145 (!) 123/92 -- -- 70 14 97 %  08/07/19 1130 (!) 119/92 -- -- 78 10 98 %  08/07/19 1118 109/82 97.9 F (36.6 C) Oral 80 19 95 %  08/07/19 0954 (!) 162/100 -- -- 86 18 99 %     Intake/Output Summary (Last 24 hours) at 08/08/2019 0849 Last data filed at 08/08/2019 0842 Gross per 24 hour  Intake 4233.9 ml  Output 3872 ml  Net 361.9 ml        Recent Labs    08/07/19 0907 08/08/19 0546  WBC 5.8 11.8*  HGB 12.1 9.9*  HCT 36.6 30.5*  PLT 198 165   CMP     Component Value Date/Time   NA 137 08/06/2019 2357   K 3.5 08/06/2019 2357   CL 103 08/06/2019 2357   CO2 21 (L) 08/06/2019 2357   GLUCOSE 87 08/06/2019 2357  BUN 12 08/06/2019 2357   CREATININE 0.66 08/06/2019 2357   CALCIUM 9.0 08/06/2019 2357   PROT 6.4 (L) 08/06/2019 2357   ALBUMIN 2.7 (L) 08/06/2019 2357   AST 27 08/06/2019 2357   ALT 20 08/06/2019 2357   ALKPHOS 89 08/06/2019 2357   BILITOT 0.4 08/06/2019 2357   GFRNONAA >60 08/06/2019 2357   GFRAA >60 08/06/2019 2357   Filed Weights   08/06/19 2255  Weight: 107.5 kg     Blood type: --/--/A POS (04/28 0242)  Rubella: Immune (10/12 0000)  Vaccines: TDaP UTD         Flu    UTD   Physical Exam:  General: alert, cooperative and no distress CV: Regular rate and rhythm Resp: clear Abdomen: soft, nontender, decreased bowel sounds, moderate tympany  Incision: clean, dry and intact Uterine Fundus: firm, below umbilicus, nontender Lochia: minimal Ext: edema +1 LE's and generalized      Assessment/Plan: 34 y.o.   POD# 1. [id number]                  Principal Problem:   Postpartum care following cesarean delivery 4/28 Active Problems:   Pre-eclampsia in third trimester  - labile BP to mild range, no neural sx, LFT's pending but normal labs on admit  - Mag Sulfate therapy x 24 hrs (DC at 12pm today)  - continue monitoring  closely, daily weights and strict I&O  - will add Procardia if BP remains labile today    Cesarean delivery - breech  - gas distention  - encouraged ambulation, warm fluids today for gut motility  Maternal anemia - ABL  - start Niferex 150mg  daily and Magnesium oxide 400mg  daily   Doing well, stable.               Advance diet as tolerated Encourage rest when baby rests Breastfeeding support Routine post-op care  Juliene Pina, CNM, MSN 08/08/2019, 8:49 AM

## 2019-08-08 NOTE — Lactation Note (Signed)
This note was copied from a baby's chart. Lactation Consultation Note  Patient Name: Girl Lanaiyah Musgraves Today's Date: 08/08/2019   P1, Baby 81 hours old.  Request for assistance.  C-section, Mother MgSo4.  5% weight loss in 24 hours.  LC completed oral assessment due to difficulty sustaining latch.  Noted disorganized suck and short mid anterior lingual frenulum. Reviewed hand expression with drops expressed. Assisted w/ latching in football hold.  Baby opens wide and has short sucking burst and comes off breast sleepy. Repeated pattern off and on numerous times, unable to sustain latch. Applied #20NS with no change in sucking patttern. Encouraged mother to support her breast and compress when baby latches. Advised mother to post pump after each feeding until baby can sustain latch. Suggested to parents if would be a good time to consider supplementing considering infant's weight loss.  Offered donor milk and FOB stated yes and MOB seems unsure. Discussed with Corrine RN and she will follow up shortly with family to determine wishes.  Reviewed DM procedure with RN.  Provided RN with LPI volume guidelines.      Maternal Data    Feeding Feeding Type: Breast Fed  LATCH Score                   Interventions    Lactation Tools Discussed/Used     Consult Status      Carlye Grippe 08/08/2019, 11:22 AM

## 2019-08-08 NOTE — Progress Notes (Signed)
CSW received consult for hx of Anxiety and Depression.  CSW met with MOB to offer support and complete assessment.    When CSW arrived, MOB was resting in bed and MGM was attending to the baby.  CSW explained CSW's role and MOB gave CSW permission to complete the assessment while MGM was present.   CSW asked about MOB's hx and MOB acknowledged a hx of anxiety and depression.  Per MOB, MOB was dx in 205/2015 and managed symptoms with natural remedies (walking, exercising, and relying on family supports).   CSW provided education regarding the baby blues period vs. perinatal mood disorders, discussed treatment and gave resources for mental health follow up if concerns arise.  CSW recommends self-evaluation during the postpartum time period using the New Mom Checklist from Postpartum Progress and encouraged MOB to contact a medical professional if symptoms are noted at any time. MOB shared she use to work for [employer] and know all about PPD.  She also expressed having a great support team that she can depend on if needed. CSW assessed for safety and MOB denied SI and HI.        CSW identifies no further need for intervention and no barriers to discharge at this time.  Laurey Arrow, MSW, LCSW Clinical Social Work [phone]

## 2019-08-09 MED ORDER — LABETALOL HCL 100 MG PO TABS
100.0000 mg | ORAL_TABLET | Freq: Two times a day (BID) | ORAL | Status: DC
  Administered 2019-08-09 – 2019-08-10 (×3): 100 mg via ORAL
  Filled 2019-08-09 (×4): qty 1

## 2019-08-09 NOTE — Lactation Note (Signed)
This note was copied from a baby's chart. Lactation Consultation Note  Patient Name: Girl Aariyah Poelman Today's Date: 08/09/2019  Telephone call from RN that mom has been crying and getting very discouraged with breastfeeding.RN would like LC to follow up with mom.  Infant has had a slight increase in bili and 8 percent weight loss.Mom reports she just wants her to breastfeed. Infant is also early term.  Discussed with mom that she had complications with delivery and that baby had complications also.   Assisted in unwrapping infant and placing her STS with mom.  Did some hand expression and Reverse Pressure softening on moms right breast.  Assisted mom with latching infant in cross cradle hold.  Infant latched and breastfed fair to good with  Some rythmic sucking and a few audible swallows for 10 minutes.  Then came off.  Mom attempt to burp her and offer left breast.  She would not wake to latch. Showed dad how to syringe and finger fed. Dad then syringed infant 18 ml of mothers milk and donor milk.  Stayed for duration of pumping with mom.  Redemo how to use DEBP.  Mom has Spectra pump for home use.   LC and dad dropped syringe of donor milk on floor.  Parents aware of supplement amounts and that at this time they need to supplement past breastfeeds and or attempted breastfeeds. Parents report they are worried about baby getting used to bottles and not taking breast.  So that's why dad shown to syringe/finger feed.  Discussed trying nipple shield with mom. Urged mom to continue  Massaging, hand expression and pumping with DEBP so that when baby is ready to breastfeed  that mom will have a good supply of milk for her.  Urged to call lactation as needed.   Maternal Data    Feeding Feeding Type: Donor Breast Milk Nipple Type: Slow - flow  LATCH Score                   Interventions    Lactation Tools Discussed/Used     Consult Status      Loann Chahal Thompson Caul 08/09/2019, 6:18  PM

## 2019-08-09 NOTE — Progress Notes (Signed)
SVD: primary  S:  Pt reports feeling well. Denies ha/, visual changes. Better off magnesium/ Tolerating po/ Voiding without problems/ No n/v/ Bleeding is light/ Pain controlled withprescription NSAID's including motrin and narcotic analgesics including oxycodone/acetaminophen (Percocet, Tylox)    O:  A & O x 3 / VS: Blood pressure 140/84, pulse 80, temperature 98.7 F (37.1 C), temperature source Oral, resp. rate 18, height 5\' 6"  (1.676 m), weight 108.9 kg, last menstrual period 10/07/2018, SpO2 98 %, unknown if currently breastfeeding.  Patient Vitals for the past 24 hrs:  BP Temp Temp src Pulse Resp SpO2 Weight  08/09/19 0746 140/84 98.7 F (37.1 C) Oral 80 18 98 % --  08/09/19 0744 -- -- -- -- -- 96 % --  08/09/19 0646 -- -- -- -- -- -- 108.9 kg  08/09/19 0514 (!) 145/88 98.2 F (36.8 C) Oral 78 19 97 % --  08/08/19 2304 140/88 97.7 F (36.5 C) Oral 83 18 98 % --  08/08/19 1916 136/83 98.2 F (36.8 C) Oral 92 18 99 % --  08/08/19 1556 137/76 97.8 F (36.6 C) Oral 93 19 98 % --  08/08/19 1155 139/85 98.1 F (36.7 C) Oral 93 18 97 % --    LABS:  Results for orders placed or performed during the hospital encounter of 08/06/19 (from the past 24 hour(s))  Comprehensive metabolic panel     Status: Abnormal   Collection Time: 08/08/19  9:44 AM  Result Value Ref Range   Sodium 134 (L) 135 - 145 mmol/L   Potassium 3.8 3.5 - 5.1 mmol/L   Chloride 103 98 - 111 mmol/L   CO2 21 (L) 22 - 32 mmol/L   Glucose, Bld 105 (H) 70 - 99 mg/dL   BUN 7 6 - 20 mg/dL   Creatinine, Ser 0.84 0.44 - 1.00 mg/dL   Calcium 7.3 (L) 8.9 - 10.3 mg/dL   Total Protein 5.6 (L) 6.5 - 8.1 g/dL   Albumin 2.5 (L) 3.5 - 5.0 g/dL   AST 25 15 - 41 U/L   ALT 17 0 - 44 U/L   Alkaline Phosphatase 73 38 - 126 U/L   Total Bilirubin 0.2 (L) 0.3 - 1.2 mg/dL   GFR calc non Af Amer >60 >60 mL/min   GFR calc Af Amer >60 >60 mL/min   Anion gap 10 5 - 15    I&O: I/O last 3 completed shifts: In: 4922.9 [P.O.:2740;  I.V.:2182.9] Out: 5500 [Urine:5500]   Total I/O In: 3 [I.V.:3] Out: 0   Lungs: chest clear, no wheezing, rales, normal symmetric air entry  Heart: regular rate and rhythm, S1, S2 normal, no murmur, click, rub or gallop  Abdomen: uterus firm @ umb  primary dressing honeycomb c/c/intact  Perineum: is normal  Lochia: light  Extremities:no redness or tenderness in the calves or thighs, edema tr-+1    A/P: POD # 2/PPD # 2/ [id number] s/p C/S S/p preeclampsia s/p magnesium sulfate on labetalol  Doing well  Continue routine post partum orders Cont with BP med

## 2019-08-09 NOTE — Lactation Note (Addendum)
This note was copied from a baby's chart. Lactation Consultation Note  Patient Name: Diana Mccormick Today's Date: 08/09/2019   P1, Baby 24 hours old.  Baby has been breastfeeding and supplementing with donor milk. Reviewed volume guidelines and suggest parents increase supplementation volume to 20-30 ml.  Mother was on MgSo4 and c-section.  7.8% weight loss.  < 6 lbs.  Mother states baby did latch well this morning but most feedings she has been sleepy at the breast.   Spoke with Ped MD to put in a baby specific order for DM. Baby recently was fed 10 ml DM with slow flow nipple.   With LC present, Mother attempted and baby sleepy at the breast so suggest allowing baby to rest. Mother has been pumping.  Praised her for her efforts.  She has DEBP at home. Plan: 1. Offer breast when baby cues that he/she is hungry, or awaken baby for feeding at 3 hrs. 2.  Breast feed baby, asking for help prn.  Limit to 30 mins so not to overtire baby. 3. If baby does not latch after 10 min of attempt - give supplemental DM and any volume pumped.  Family is using slow flow nipple.  4.  Pump both breasts 15-20 minutes on initiation setting, adding breast massage and hand expression to collect as much colostrum as possible to feed baby. 5.  Feed baby 10-20 ml EBM+/DM after breastfeeding per LPTI volume guidelines increasing per day of life and as baby desires.   Shortly parents aware to increase volume to 20-84ml.         Maternal Data    Feeding Feeding Type: Donor Breast Milk Nipple Type: Slow - flow  LATCH Score                   Interventions    Lactation Tools Discussed/Used     Consult Status      Carlye Grippe 08/09/2019, 9:26 AM

## 2019-08-10 ENCOUNTER — Inpatient Hospital Stay (HOSPITAL_COMMUNITY): Admission: RE | Admit: 2019-08-10 | Payer: 59 | Source: Home / Self Care | Admitting: Obstetrics

## 2019-08-10 ENCOUNTER — Encounter (HOSPITAL_COMMUNITY): Admission: RE | Payer: Self-pay | Source: Home / Self Care

## 2019-08-10 SURGERY — Surgical Case
Anesthesia: Spinal

## 2019-08-10 MED ORDER — NIFEDIPINE ER OSMOTIC RELEASE 30 MG PO TB24
30.0000 mg | ORAL_TABLET | Freq: Every day | ORAL | Status: DC
  Administered 2019-08-10 – 2019-08-11 (×2): 30 mg via ORAL
  Filled 2019-08-10 (×2): qty 1

## 2019-08-10 NOTE — Lactation Note (Signed)
This note was copied from a baby's chart. Lactation Consultation Note  Patient Name: Diana Mccormick Today's Date: 08/10/2019 Reason for consult: Follow-up assessment;Difficult latch;1st time breastfeeding;Early term 37-38.6wks;Infant weight loss Baby Diana Shawn Stall now 41 hours old.  Parents bf her on arrival in cross cradle hold on left.  Nipple shield halfway out of infants mouth.  Asked mom if we could try football since she was now using a nipple shield.  Mom resisted at first,  But urged her to try.  Explained it was easier to see infant was breastfeeding correctly with the nipple shield. Infant has had plenty of voids but only one stool last 24 hours.  Switched infant to football on left breast.  Some rythmic sucking and audible swallows noted.  Fair to good bf observed.  Added 5 french feeding tube corner of infants mouth with syringe.  Mom had 20 ml of breastmilk pumped.  Showed dad how to give it to her at breast with 5 french.  Dad reports he likes that better.  Infant did not take it all.  Came off breast after about 15 ml.  Dad tried tried to give her last ml via finger.  There were a few ml lost in the nipple shield. Came out when mom pulled it off.  Urged parents to continue to supplement her until sees pediatrician on monday then may have a new plan.  Discussed outpatient lactation.  Mom has breastfeeding resource list for home use.   Maternal Data    Feeding Feeding Type: Breast Fed Nipple Type: (5 french)  LATCH Score Latch: Grasps breast easily, tongue down, lips flanged, rhythmical sucking.(mom had her with only half of n/s in mouth)  Audible Swallowing: A few with stimulation(more swallows heard with breastmilk added via 5 friench and )  Type of Nipple: Everted at rest and after stimulation  Comfort (Breast/Nipple): Filling, red/small blisters or bruises, mild/mod discomfort(right nipple with abrasions on tips)  Hold (Positioning): No assistance needed to correctly  position infant at breast.  LATCH Score: 8  Interventions Interventions: Breast feeding basics reviewed;Assisted with latch;Adjust position;Support pillows;Position options;Expressed milk;DEBP  Lactation Tools Discussed/Used Tools: Pump;Nipple Shields;76F feeding tube / Syringe Nipple shield size: 20 Breast pump type: Double-Electric Breast Pump Pump Review: Setup, frequency, and cleaning;Milk Storage   Consult Status Consult Status: Complete Date: 08/10/19 Follow-up type: [place] 08/10/2019, 12:24 PM

## 2019-08-10 NOTE — Progress Notes (Signed)
Pt. refused afternoon assessment on baby. Pt. Educated on reason for Q8H assessments. Pt. voiced understanding.

## 2019-08-10 NOTE — Progress Notes (Signed)
Patient ID: Diana Mccormick, female   DOB: 1986/03/16, 34 y.o.   MRN: [id number] Subjective: POD# 3 Live born female  Birth Weight: 6 lb 8.9 oz (2975 g) APGAR: 8, 9  Newborn Delivery   Birth date/time: 08/07/2019 10:36:00 Delivery type: C-Section, Low Transverse Trial of labor: No C-section categorization: Primary     Baby name: Legrand Rams Delivering provider: Brien Few  Feeding: breast  Pain control at delivery: Spinal   Reports feeling much better today.   Patient reports tolerating PO.   Breast symptoms: milk coming in Pain controlled with PO meds Denies HA/SOB/C/P/N/V/dizziness. Flatus present, + BM x 2. She reports vaginal bleeding as normal, without clots.  She is ambulating, urinating without difficulty.     Objective:   Patient Vitals for the past 24 hrs:  BP Temp Temp src Pulse Resp SpO2 Weight  08/10/19 0745 (!) 148/87 98.5 F (36.9 C) Oral 84 18 96 % --  08/10/19 0553 -- -- -- -- -- -- 103.8 kg  08/10/19 0343 138/81 98.9 F (37.2 C) Oral 86 18 100 % --  08/09/19 2353 135/80 98.7 F (37.1 C) Oral 86 18 100 % --  08/09/19 1930 (!) 151/81 98.2 F (36.8 C) Oral 82 18 98 % --  08/09/19 1534 (!) 147/93 98.7 F (37.1 C) Oral 93 18 98 % --  08/09/19 1434 (!) 150/87 -- -- 84 -- -- --  08/09/19 1138 (!) 151/95 98.4 F (36.9 C) Oral 95 18 98 % --   Weight change: -5.103 kg     Intake/Output Summary (Last 24 hours) at 08/10/2019 1059 Last data filed at 08/10/2019 0745 Gross per 24 hour  Intake 2400 ml  Output 8100 ml  Net -5700 ml        Recent Labs    08/08/19 0546  WBC 11.8*  HGB 9.9*  HCT 30.5*  PLT 165     Blood type: --/--/A POS (04/28 0242)  Rubella: Immune (10/12 0000)  Vaccines: TDaP UTD         Flu    UTD   Physical Exam:  General: alert, cooperative and no distress Abdomen: soft, nontender, normal bowel sounds Incision: clean, dry and intact Uterine Fundus: firm, below umbilicus, nontender Lochia: minimal Ext: edema +1  pedal      Assessment/Plan: 34 y.o.   POD# 3. [id number]                  Principal Problem:   Postpartum care following cesarean delivery 4/28 Active Problems:   Pre-eclampsia in third trimester  - S/P Mag Sulfate x 24 hrs, good diuresis, -5 kilos in past 2 days, no neural sx  - Labetalol 100 mg not effective in controlling labile BP to mild range  - change to Procardia 30 xl today  - continue inpatient obs.   Cesarean delivery - breech  - stable, continue routine orders  POC in consult w/ Dr. Madelaine Bhat, CNM, MSN 08/10/2019, 10:59 AM

## 2019-08-11 ENCOUNTER — Encounter (HOSPITAL_COMMUNITY): Payer: Self-pay | Admitting: Obstetrics and Gynecology

## 2019-08-11 MED ORDER — OXYCODONE HCL 5 MG PO TABS: 5.0000 mg | ORAL_TABLET | ORAL | 0 refills | Status: AC | PRN

## 2019-08-11 MED ORDER — POLYSACCHARIDE IRON COMPLEX 150 MG PO CAPS: 150.0000 mg | ORAL_CAPSULE | Freq: Every day | ORAL | 1 refills | Status: AC

## 2019-08-11 MED ORDER — MAGNESIUM OXIDE 400 (241.3 MG) MG PO TABS: 400.0000 mg | ORAL_TABLET | Freq: Two times a day (BID) | ORAL | 1 refills | Status: AC

## 2019-08-11 MED ORDER — IBUPROFEN 800 MG PO TABS: 800.0000 mg | ORAL_TABLET | Freq: Four times a day (QID) | ORAL | 0 refills | Status: AC

## 2019-08-11 MED ORDER — NIFEDIPINE ER 30 MG PO TB24: 30.0000 mg | ORAL_TABLET | Freq: Every day | ORAL | 1 refills | Status: AC

## 2019-08-11 NOTE — Progress Notes (Signed)
D/c with family  Teaching complete

## 2019-08-11 NOTE — Lactation Note (Signed)
This note was copied from a baby's chart. Lactation Consultation Note  Patient Name: Diana Mccormick Today's Date: 08/11/2019 Reason for consult: Follow-up assessment;1st time breastfeeding;Primapara  1139 - 1154 - I followed up with Ms. Trentman. Her milk has transitioned; she has EBM in the refrigerator. Breasts are full but not engorged. Demonstrated RPS and techniques for lymphatic drainage. Recommended breast feeding baby Ellie on demand 8-12 times a day. Pre and post pump to soften breasts or help with engorgement (comfort pump).   She plans to follow up with [facility] tomorrow and has follow up [facility] appointment while there scheduled. I suggested that a weighted feeding would help her determine if baby continues to need supplementation after breast feeding. Ms. Shawn Stall states that Normand Sloop is softening her breasts. This is visibly noticeable (the side she last fed on is visible softer).  We discussed signs of good transfer and how to know if baby is getting enough to eat. I reviewed our breast feeding community resources.   Plan:  Breast feed on demand 8-12 times a day. Look for softening of breasts with feedings. Pre pump or do RPS if engorged. Post pump as needed for comfort. Supplement is she feels comfortable continuing to do so today until her first follow up with her pediatrician tomorrow. Try to do a weighted feeding assessment with her OP appointment to determine if she can discontinue supplementation.  Maternal Data Has patient been taught Hand Expression?: Yes Does the patient have breastfeeding experience prior to this delivery?: No  Feeding Feeding Type: Breast Fed  LATCH Score Latch: Grasps breast easily, tongue down, lips flanged, rhythmical sucking.  Audible Swallowing: Spontaneous and intermittent  Type of Nipple: Everted at rest and after stimulation  Comfort (Breast/Nipple): Soft / non-tender  Hold (Positioning): No assistance needed to correctly position  infant at breast.  LATCH Score: 10  Interventions Interventions: Breast feeding basics reviewed;Hand express;Reverse pressure;Pre-pump if needed;DEBP  Lactation Tools Discussed/Used Tools: Flanges Flange Size: 27 Breast pump type: Double-Electric Breast Pump Pump Review: Setup, frequency, and cleaning   Consult Status Consult Status: Complete Date: 08/11/19 Follow-up type: Call as needed    Lenore Manner 08/11/2019, 12:00 PM

## 2019-08-11 NOTE — Discharge Instructions (Signed)
Postpartum Care After Cesarean Delivery This sheet gives you information about how to care for yourself from the time you deliver your baby to up to 6-12 weeks after delivery (postpartum period). Your health care provider may also give you more specific instructions. If you have problems or questions, contact your health care provider. Follow these instructions at home: Medicines  Take over-the-counter and prescription medicines only as told by your health care provider.  If you were prescribed an antibiotic medicine, take it as told by your health care provider. Do not stop taking the antibiotic even if you start to feel better.  Ask your health care provider if the medicine prescribed to you: ? Requires you to avoid driving or using heavy machinery. ? Can cause constipation. You may need to take actions to prevent or treat constipation, such as:  Drink enough fluid to keep your urine pale yellow.  Take over-the-counter or prescription medicines.  Eat foods that are high in fiber, such as beans, whole grains, and fresh fruits and vegetables.  Limit foods that are high in fat and processed sugars, such as fried or sweet foods. Activity  Gradually return to your normal activities as told by your health care provider.  Avoid activities that take a lot of effort and energy (are strenuous) until approved by your health care provider. Walking at a slow to moderate pace is usually safe. Ask your health care provider what activities are safe for you. ? Do not lift anything that is heavier than your baby or 10 lb (4.5 kg) as told by your health care provider. ? Do not vacuum, climb stairs, or drive a car for as long as told by your health care provider.  If possible, have someone help you at home until you are able to do your usual activities yourself.  Rest as much as possible. Try to rest or take naps while your baby is sleeping. Vaginal bleeding  It is normal to have vaginal bleeding  (lochia) after delivery. Wear a sanitary pad to absorb vaginal bleeding and discharge. ? During the first week after delivery, the amount and appearance of lochia is often similar to a menstrual period. ? Over the next few weeks, it will gradually decrease to a dry, yellow-brown discharge. ? For most women, lochia stops completely by 4-6 weeks after delivery. Vaginal bleeding can vary from woman to woman.  Change your sanitary pads frequently. Watch for any changes in your flow, such as: ? A sudden increase in volume. ? A change in color. ? Large blood clots.  If you pass a blood clot, save it and call your health care provider to discuss. Do not flush blood clots down the toilet before you get instructions from your health care provider.  Do not use tampons or douches until your health care provider says this is safe.  If you are not breastfeeding, your period should return 6-8 weeks after delivery. If you are breastfeeding, your period may return anytime between 8 weeks after delivery and the time that you stop breastfeeding. Perineal care   If your C-section (Cesarean section) was unplanned, and you were allowed to labor and push before delivery, you may have pain, swelling, and discomfort of the tissue between your vaginal opening and your anus (perineum). You may also have an incision in the tissue (episiotomy) or the tissue may have torn during delivery. Follow these instructions as told by your health care provider: ? Keep your perineum clean and dry as told by   your health care provider. Use medicated pads and pain-relieving sprays and creams as directed. ? If you have an episiotomy or vaginal tear, check the area every day for signs of infection. Check for:  Redness, swelling, or pain.  Fluid or blood.  Warmth.  Pus or a bad smell. ? You may be given a squirt bottle to use instead of wiping to clean the perineum area after you go to the bathroom. As you start healing, you may use  the squirt bottle before wiping yourself. Make sure to wipe gently. ? To relieve pain caused by an episiotomy, vaginal tear, or hemorrhoids, try taking a warm sitz bath 2-3 times a day. A sitz bath is a warm water bath that is taken while you are sitting down. The water should only come up to your hips and should cover your buttocks. Breast care  Within the first few days after delivery, your breasts may feel heavy, full, and uncomfortable (breast engorgement). You may also have milk leaking from your breasts. Your health care provider can suggest ways to help relieve breast discomfort. Breast engorgement should go away within a few days.  If you are breastfeeding: ? Wear a bra that supports your breasts and fits you well. ? Keep your nipples clean and dry. Apply creams and ointments as told by your health care provider. ? You may need to use breast pads to absorb milk leakage. ? You may have uterine contractions every time you breastfeed for several weeks after delivery. Uterine contractions help your uterus return to its normal size. ? If you have any problems with breastfeeding, work with your health care provider or a lactation consultant.  If you are not breastfeeding: ? Avoid touching your breasts as this can make your breasts produce more milk. ? Wear a well-fitting bra and use cold packs to help with swelling. ? Do not squeeze out (express) milk. This causes you to make more milk. Intimacy and sexuality  Ask your health care provider when you can engage in sexual activity. This may depend on your: ? Risk of infection. ? Healing rate. ? Comfort and desire to engage in sexual activity.  You are able to get pregnant after delivery, even if you have not had your period. If desired, talk with your health care provider about methods of family planning or birth control (contraception). Lifestyle  Do not use any products that contain nicotine or tobacco, such as cigarettes, e-cigarettes,  and chewing tobacco. If you need help quitting, ask your health care provider.  Do not drink alcohol, especially if you are breastfeeding. Eating and drinking   Drink enough fluid to keep your urine pale yellow.  Eat high-fiber foods every day. These may help prevent or relieve constipation. High-fiber foods include: ? Whole grain cereals and breads. ? Brown rice. ? Beans. ? Fresh fruits and vegetables.  Take your prenatal vitamins until your postpartum checkup or until your health care provider tells you it is okay to stop. General instructions  Keep all follow-up visits for you and your baby as told by your health care provider. Most women visit their health care provider for a postpartum checkup within the first 3-6 weeks after delivery. Contact a health care provider if you:  Feel unable to cope with the changes that a new baby brings to your life, and these feelings do not go away.  Feel unusually sad or worried.  Have breasts that are painful, hard, or turn red.  Have a fever.    Have trouble holding urine or keeping urine from leaking.  Have little or no interest in activities you used to enjoy.  Have not breastfed at all and you have not had a menstrual period for 12 weeks after delivery.  Have stopped breastfeeding and you have not had a menstrual period for 12 weeks after you stopped breastfeeding.  Have questions about caring for yourself or your baby.  Pass a blood clot from your vagina. Get help right away if you:  Have chest pain.  Have difficulty breathing.  Have sudden, severe leg pain.  Have severe pain or cramping in your abdomen.  Bleed from your vagina so much that you fill more than one sanitary pad in one hour. Bleeding should not be heavier than your heaviest period.  Develop a severe headache.  Faint.  Have blurred vision or spots in your vision.  Have a bad-smelling vaginal discharge.  Have thoughts about hurting yourself or your  baby. If you ever feel like you may hurt yourself or others, or have thoughts about taking your own life, get help right away. You can go to your nearest emergency department or call:  Your local emergency services (911 in the [place]  A suicide crisis helpline, such as the National Suicide Prevention Lifeline at 1-800-273-8255. This is open 24 hours a day. Summary  The period of time from when you deliver your baby to up to 6-12 weeks after delivery is called the postpartum period.  Gradually return to your normal activities as told by your health care provider.  Keep all follow-up visits for you and your baby as told by your health care provider. This information is not intended to replace advice given to you by your health care provider. Make sure you discuss any questions you have with your health care provider. Document Revised: 11/15/2017 Document Reviewed: 11/15/2017 Elsevier Patient Education  2020 [employer]. Preeclampsia and Eclampsia Preeclampsia is a serious condition that may develop during pregnancy. This condition causes high blood pressure and increased protein in your urine along with other symptoms, such as headaches and vision changes. These symptoms may develop as the condition gets worse. Preeclampsia may occur at 20 weeks of pregnancy or later. Diagnosing and treating preeclampsia early is very important. If not treated early, it can cause serious problems for you and your baby. One problem it can lead to is eclampsia. Eclampsia is a condition that causes muscle jerking or shaking (convulsions or seizures) and other serious problems for the mother. During pregnancy, delivering your baby may be the best treatment for preeclampsia or eclampsia. For most women, preeclampsia and eclampsia symptoms go away after giving birth. In rare cases, a woman may develop preeclampsia after giving birth (postpartum preeclampsia). This usually occurs within 48 hours after childbirth but may  occur up to 6 weeks after giving birth. What are the causes? The cause of preeclampsia is not known. What increases the risk? The following risk factors make you more likely to develop preeclampsia:  Being pregnant for the first time.  Having had preeclampsia during a past pregnancy.  Having a family history of preeclampsia.  Having high blood pressure.  Being pregnant with more than one baby.  Being 35 or older.  Being African-American.  Having kidney disease or diabetes.  Having medical conditions such as lupus or blood diseases.  Being very overweight (obese). What are the signs or symptoms? The most common symptoms are:  Severe headaches.  Vision problems, such as blurred or double vision.    Abdominal pain, especially upper abdominal pain. Other symptoms that may develop as the condition gets worse include:  Sudden weight gain.  Sudden swelling of the hands, face, legs, and feet.  Severe nausea and vomiting.  Numbness in the face, arms, legs, and feet.  Dizziness.  Urinating less than usual.  Slurred speech.  Convulsions or seizures. How is this diagnosed? There are no screening tests for preeclampsia. Your health care provider will ask you about symptoms and check for signs of preeclampsia during your prenatal visits. You may also have tests that include:  Checking your blood pressure.  Urine tests to check for protein. Your health care provider will check for this at every prenatal visit.  Blood tests.  Monitoring your baby's heart rate.  Ultrasound. How is this treated? You and your health care provider will determine the treatment approach that is best for you. Treatment may include:  Having more frequent prenatal exams to check for signs of preeclampsia, if you have an increased risk for preeclampsia.  Medicine to lower your blood pressure.  Staying in the hospital, if your condition is severe. There, treatment will focus on controlling  your blood pressure and the amount of fluids in your body (fluid retention).  Taking medicine (magnesium sulfate) to prevent seizures. This may be given as an injection or through an IV.  Taking a low-dose aspirin during your pregnancy.  Delivering your baby early. You may have your labor started with medicine (induced), or you may have a cesarean delivery. Follow these instructions at home: Eating and drinking   Drink enough fluid to keep your urine pale yellow.  Avoid caffeine. Lifestyle  Do not use any products that contain nicotine or tobacco, such as cigarettes and e-cigarettes. If you need help quitting, ask your health care provider.  Do not use alcohol or drugs.  Avoid stress as much as possible. Rest and get plenty of sleep. General instructions  Take over-the-counter and prescription medicines only as told by your health care provider.  When lying down, lie on your left side. This keeps pressure off your major blood vessels.  When sitting or lying down, raise (elevate) your feet. Try putting some pillows underneath your lower legs.  Exercise regularly. Ask your health care provider what kinds of exercise are best for you.  Keep all follow-up and prenatal visits as told by your health care provider. This is important. How is this prevented? There is no known way of preventing preeclampsia or eclampsia from developing. However, to lower your risk of complications and detect problems early:  Get regular prenatal care. Your health care provider may be able to diagnose and treat the condition early.  Maintain a healthy weight. Ask your health care provider for help managing weight gain during pregnancy.  Work with your health care provider to manage any long-term (chronic) health conditions you have, such as diabetes or kidney problems.  You may have tests of your blood pressure and kidney function after giving birth.  Your health care provider may have you take  low-dose aspirin during your next pregnancy. Contact a health care provider if:  You have symptoms that your health care provider told you may require more treatment or monitoring, such as: ? Headaches. ? Nausea or vomiting. ? Abdominal pain. ? Dizziness. ? Light-headedness. Get help right away if:  You have severe: ? Abdominal pain. ? Headaches that do not get better. ? Dizziness. ? Vision problems. ? Confusion. ? Nausea or vomiting.  You have any   of the following: ? A seizure. ? Sudden, rapid weight gain. ? Sudden swelling in your hands, ankles, or face. ? Trouble moving any part of your body. ? Numbness in any part of your body. ? Trouble speaking. ? Abnormal bleeding.  You faint. Summary  Preeclampsia is a serious condition that may develop during pregnancy.  This condition causes high blood pressure and increased protein in your urine along with other symptoms, such as headaches and vision changes.  Diagnosing and treating preeclampsia early is very important. If not treated early, it can cause serious problems for you and your baby.  Get help right away if you have symptoms that your health care provider told you to watch for. This information is not intended to replace advice given to you by your health care provider. Make sure you discuss any questions you have with your health care provider. Document Revised: 11/28/2017 Document Reviewed: 11/02/2015 Elsevier Patient Education  2020 [employer].  

## 2019-08-11 NOTE — Discharge Summary (Signed)
OB Discharge Summary  Patient Name: Diana Mccormick DOB: 19-Aug-1985 MRN: [id number]  Date of admission: 08/06/2019 Delivering MD: Brien Few   Date of discharge: 08/11/2019  Admitting diagnosis: Pre-eclampsia in third trimester [O14.93] Intrauterine pregnancy: [redacted]w[redacted]d     Secondary diagnosis:Principal Problem:   Postpartum care following cesarean delivery 4/28 Active Problems:   Pre-eclampsia in third trimester   Cesarean delivery - breech  Additional problems: Preeclampsia     Discharge diagnosis: Preeclampsia (mild)                                                                     Post partum procedures:None  Augmentation: Primary cesarean section for breech presentation  Complications: None  Hospital course:  Sceduled C/S   34 y.o. yo [id number] at [redacted]w[redacted]d was admitted to the hospital 08/06/2019 for scheduled cesarean section with the following indication:Malpresentation and Done early due to preeclampsia.  Membrane Rupture Time/Date: 10:36 AM ,08/07/2019   Patient delivered a Viable infant.08/07/2019  Details of operation can be found in separate operative note.  Pateint had an uncomplicated postpartum course.  She is ambulating, tolerating a regular diet, passing flatus, and urinating well. Patient is discharged home in stable condition on  08/11/19      On day of discharge patient notes ambulating without dizziness, no chest pain, no shortness of breath.  She has no right upper quadrant pain, some gaseous distention but has had bowel movement and is passing gas.  Patient notes bleeding is minimal.  Patient notes no headache or vision change although she does have a rare headache when she has not been after some time.  Patient is tolerating regular p.o. without nausea or emesis.  Patient notes minimal vaginal bleeding.  Patient notes edema is markedly improved.  Patient expresses a strong desire to go home.  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  Physical exam  Vitals:   08/10/19 2012 08/11/19 0015 08/11/19 0633 08/11/19 0800  BP: 137/83 136/81 (!) 149/90 (!) 146/91  Pulse: 100 90 93 80  Resp: 18 18 16 18   Temp: 97.9 F (36.6 C) 98.4 F (36.9 C) 98.4 F (36.9 C) 98.6 F (37 C)  TempSrc: Oral Oral Oral Oral  SpO2: 99% 99% 100% 100%  Weight:    99.7 kg  Height:       General: alert, cooperative and no distress Lochia: appropriate Uterine Fundus: firm Incision: Healing well with no significant drainage DVT Evaluation: No evidence of DVT seen on physical exam. Labs: Lab Results  Component Value Date   WBC 11.8 (H) 08/08/2019   HGB 9.9 (L) 08/08/2019   HCT 30.5 (L) 08/08/2019   MCV 88.7 08/08/2019   PLT 165 08/08/2019   CMP Latest Ref Rng & Units 08/08/2019  Glucose 70 - 99 mg/dL 105(H)  BUN 6 - 20 mg/dL 7  Creatinine 0.44 - 1.00 mg/dL 0.84  Sodium 135 - 145 mmol/L 134(L)  Potassium 3.5 - 5.1 mmol/L 3.8  Chloride 98 - 111 mmol/L 103  CO2 22 - 32 mmol/L 21(L)  Calcium 8.9 - 10.3 mg/dL 7.3(L)  Total Protein 6.5 - 8.1 g/dL 5.6(L)  Total  Bilirubin 0.3 - 1.2 mg/dL 0.2(L)  Alkaline Phos 38 - 126 U/L 73  AST 15 - 41 U/L 25  ALT 0 - 44 U/L 17    Discharge instruction: per After Visit Summary and "Baby and Me Booklet".  After Visit Meds:  Allergies as of 08/11/2019      Reactions   Bactrim [sulfamethoxazole-trimethoprim] Anaphylaxis   Hypotension and Fever-like symptoms   Sulfur Anaphylaxis   Low heart rate      Medication List    STOP taking these medications   cyclobenzaprine 10 MG tablet Commonly known as: FLEXERIL     TAKE these medications   acetaminophen 500 MG tablet Commonly known as: TYLENOL Take 500 mg by mouth every 6 (six) hours as needed for moderate pain.   albuterol 108 (90 Base) MCG/ACT inhaler Commonly known as: VENTOLIN HFA Inhale 1 puff into the lungs every 4 (four) hours as needed for wheezing or shortness of breath.    albuterol (2.5 MG/3ML) 0.083% nebulizer solution Commonly known as: PROVENTIL Take 3 mLs (2.5 mg total) by nebulization every 4 (four) hours as needed for wheezing or shortness of breath.   budesonide-formoterol 160-4.5 MCG/ACT inhaler Commonly known as: SYMBICORT Inhale 2 puffs into the lungs 2 (two) times daily. What changed:   when to take this  reasons to take this   FERRALET 90 PO Take 1 tablet by mouth daily.   ibuprofen 800 MG tablet Commonly known as: ADVIL Take 1 tablet (800 mg total) by mouth every 6 (six) hours.   iron polysaccharides 150 MG capsule Commonly known as: NIFEREX Take 1 capsule (150 mg total) by mouth daily. Start taking on: Aug 12, 2019   magnesium oxide 400 (241.3 Mg) MG tablet Commonly known as: MAG-OX Take 1 tablet (400 mg total) by mouth 2 (two) times daily.   montelukast 10 MG tablet Commonly known as: SINGULAIR Take 1 tablet (10 mg total) by mouth at bedtime.   NIFEdipine 30 MG 24 hr tablet Commonly known as: ADALAT CC Take 1 tablet (30 mg total) by mouth daily. Start taking on: Aug 12, 2019   oxyCODONE 5 MG immediate release tablet Commonly known as: Oxy IR/ROXICODONE Take 1-2 tablets (5-10 mg total) by mouth every 4 (four) hours as needed for moderate pain or severe pain.   PRENATAL PO Take 1 tablet by mouth daily.       Diet: routine diet  Activity: Advance as tolerated. Pelvic rest for 6 weeks.   Outpatient follow up:3 to 4 days for blood pressure check Follow up Appt:No future appointments. Follow up visit: No follow-ups on file.  Postpartum contraception: Not Discussed  Newborn Data: Live born female  Birth Weight: 6 lb 8.9 oz (2975 g) APGAR: 27, 9  Newborn Delivery   Birth date/time: 08/07/2019 10:36:00 Delivery type: C-Section, Low Transverse Trial of labor: No C-section categorization: Primary      Baby Feeding: Not discussed Disposition:home with mother   08/11/2019 Ala Dach, MD
